# Patient Record
Sex: Female | Born: 1975 | Race: White | Hispanic: No | Marital: Married | Smoking: Former smoker
Health system: Southern US, Community
[De-identification: ages and names within clinical notes are randomized; demographics above are authoritative.]

## PROBLEM LIST (undated history)

## (undated) DIAGNOSIS — E039 Hypothyroidism, unspecified: Secondary | ICD-10-CM

## (undated) DIAGNOSIS — E785 Hyperlipidemia, unspecified: Secondary | ICD-10-CM

## (undated) DIAGNOSIS — R609 Edema, unspecified: Secondary | ICD-10-CM

## (undated) DIAGNOSIS — J40 Bronchitis, not specified as acute or chronic: Secondary | ICD-10-CM

## (undated) HISTORY — DX: Hypothyroidism, unspecified: E03.9

## (undated) HISTORY — PX: WISDOM TOOTH EXTRACTION: SHX21

## (undated) HISTORY — PX: EYE SURGERY: SHX253

## (undated) HISTORY — DX: Morbid (severe) obesity due to excess calories: E66.01

## (undated) HISTORY — DX: Hyperlipidemia, unspecified: E78.5

## (undated) HISTORY — DX: Edema, unspecified: R60.9

---

## 2006-02-19 ENCOUNTER — Emergency Department: Payer: Self-pay | Admitting: Emergency Medicine

## 2006-10-29 ENCOUNTER — Ambulatory Visit: Payer: Self-pay | Admitting: Family Medicine

## 2006-12-15 ENCOUNTER — Ambulatory Visit: Payer: Self-pay | Admitting: Internal Medicine

## 2007-01-19 IMAGING — CR DG SHOULDER 3+V*L*
1 series · 3 of 3 positions shown · non-contrast
Comparison: none

REASON FOR EXAM: Pain
COMMENTS:  LMP: Two weeks ago

PROCEDURE:     DXR - DXR SHOULDER LEFT COMPLETE  - February 19, 2006 [DATE]
RESULT:     Three views of the LEFT shoulder show no fracture, dislocation,
or other acute bony abnormality.

[Series 4185: antero_posterior · 0.11mm/px · 3 of 3 slices shown]
[im 1/3]
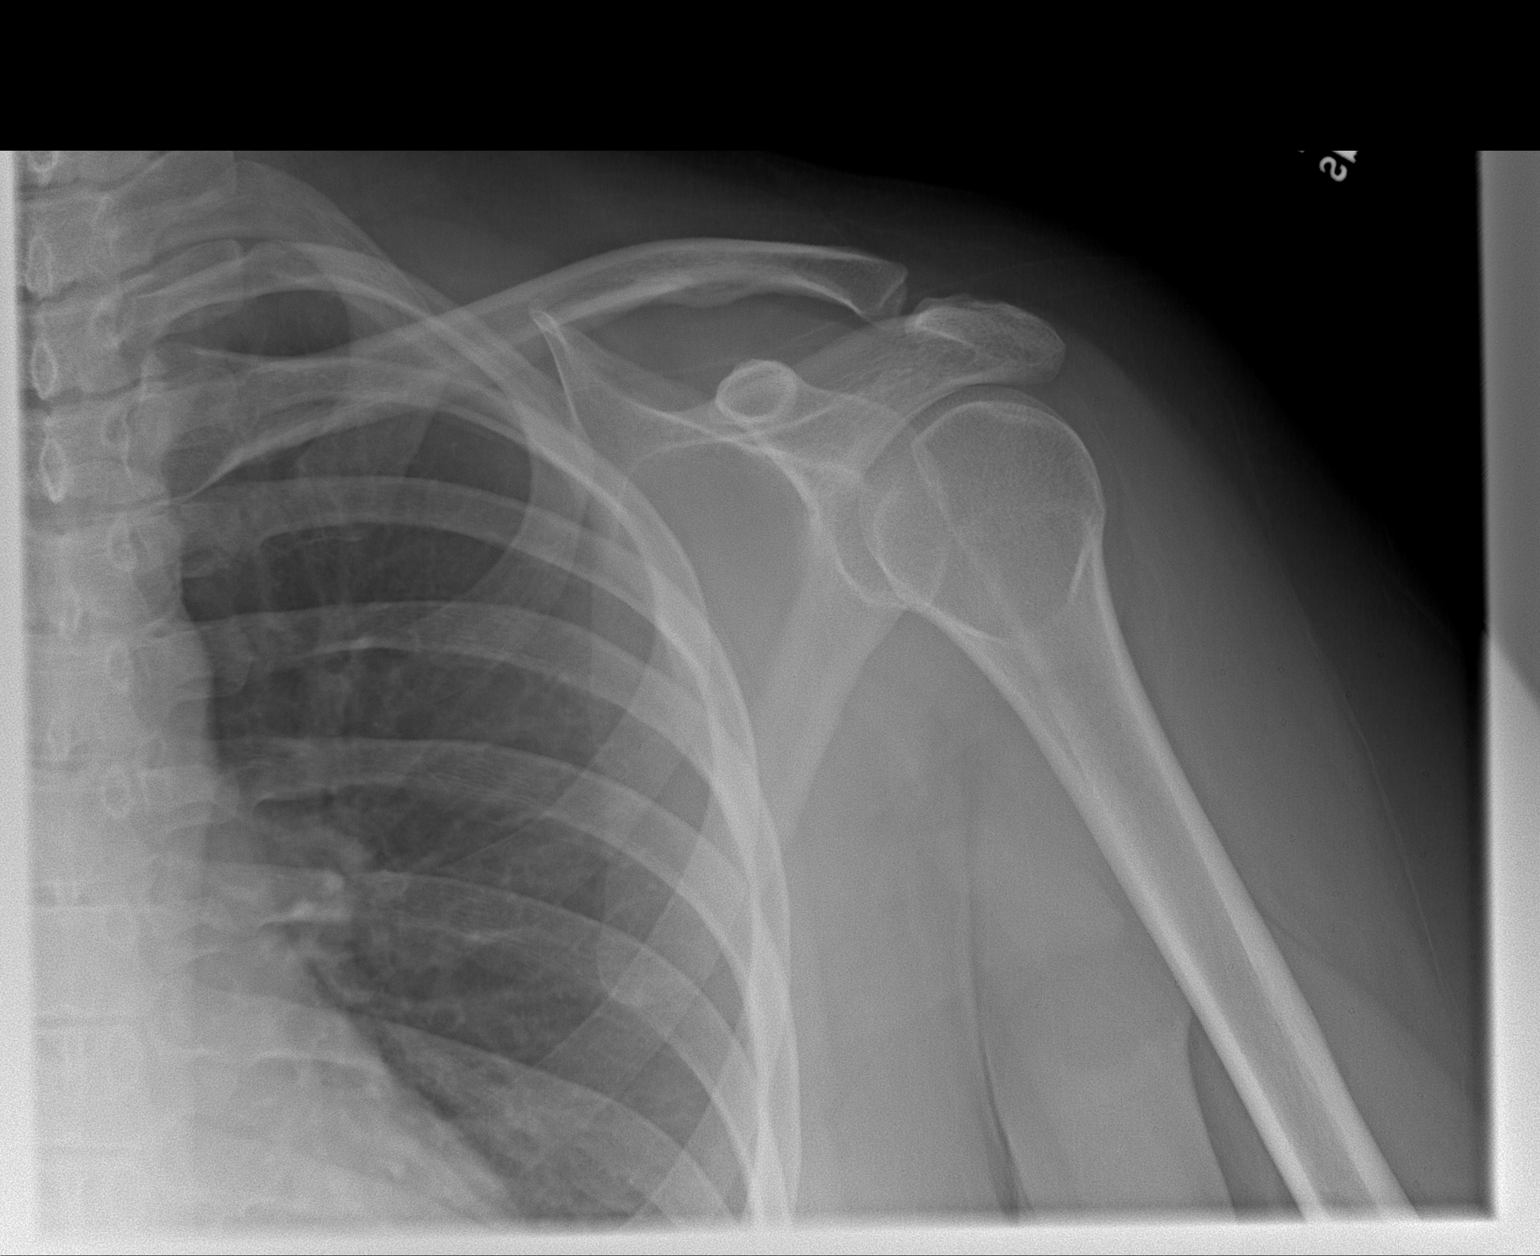
[im 2/3]
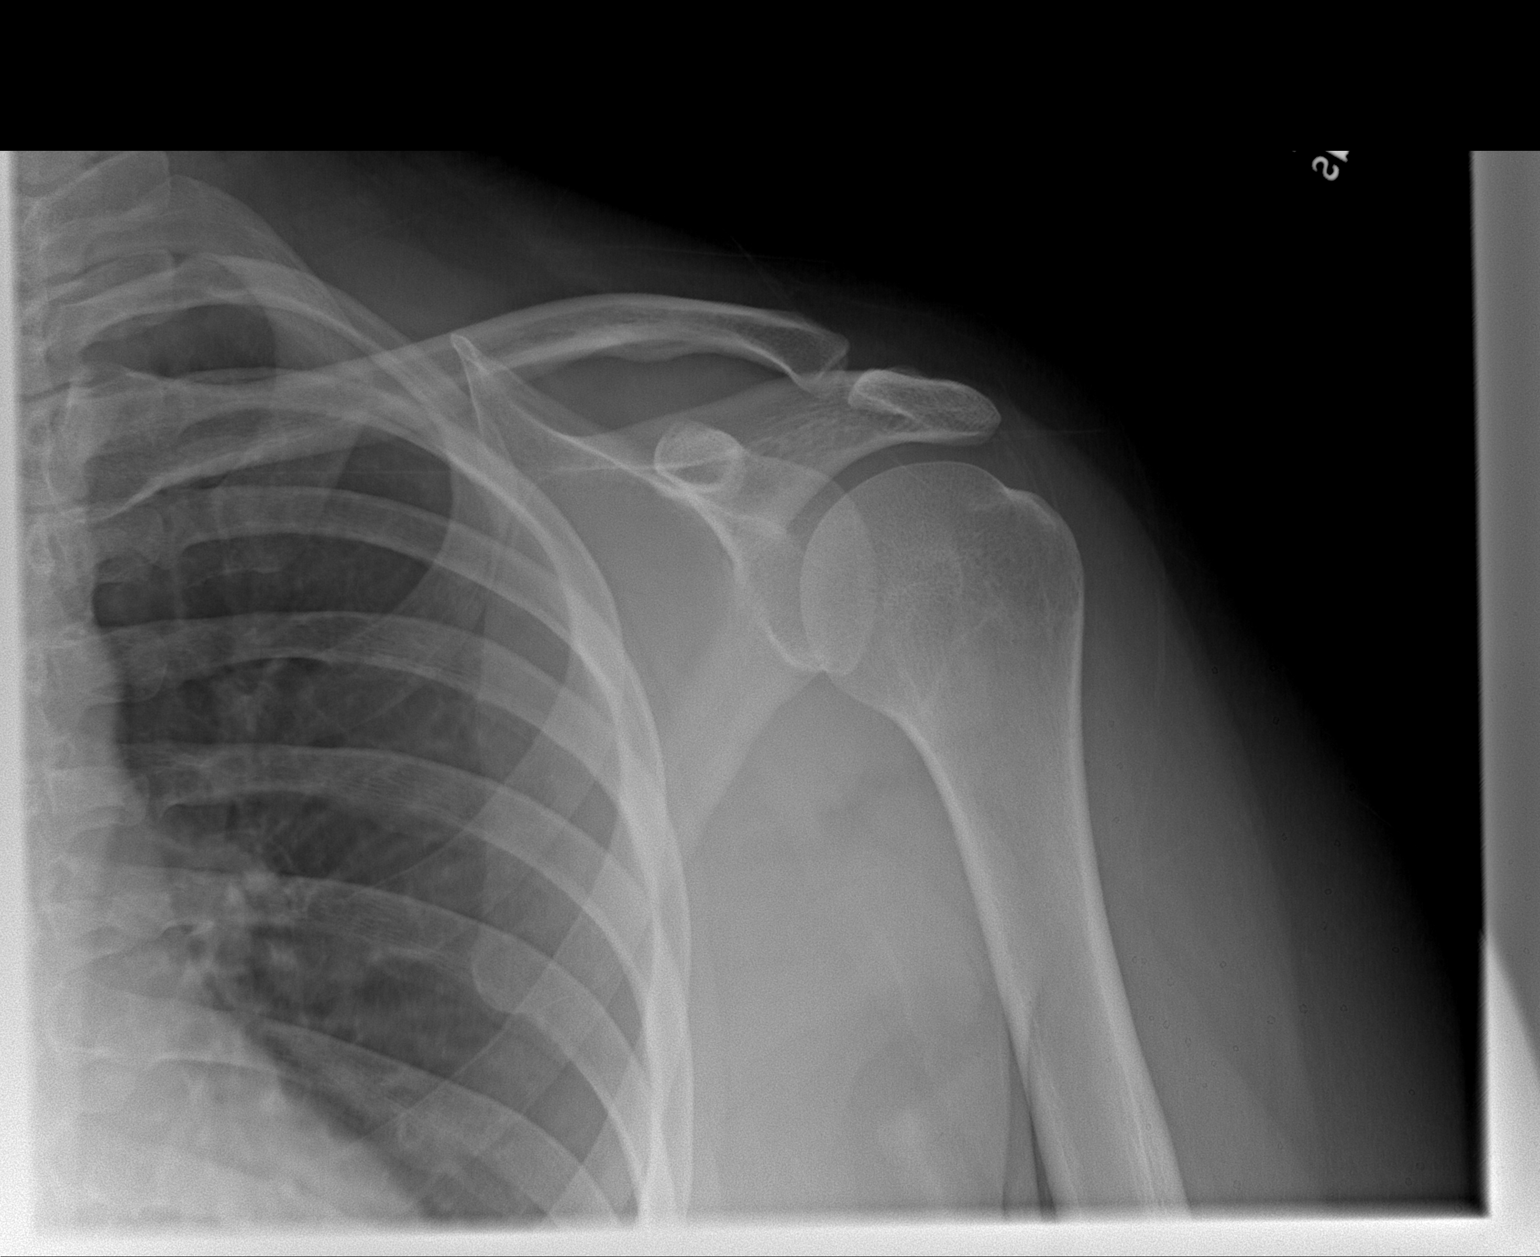
[im 3/3]
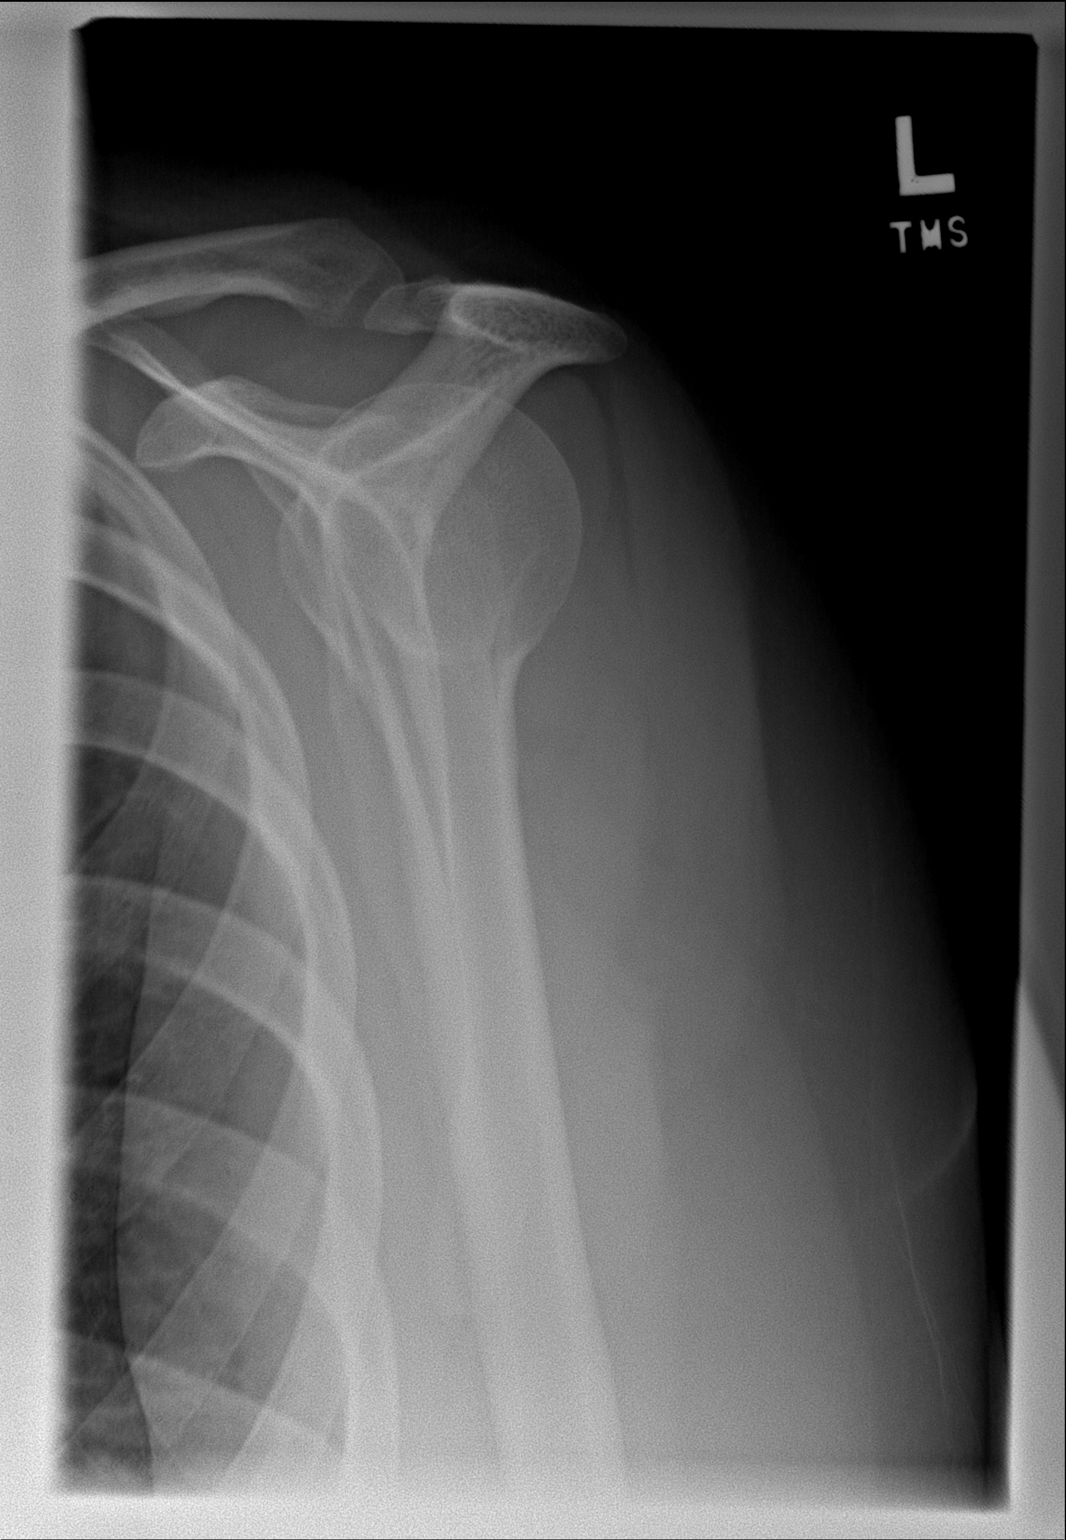

[3 of 3 positions shown; findings below may reference images not displayed]

IMPRESSION: No significant abnormalities are noted.

## 2007-09-30 ENCOUNTER — Ambulatory Visit: Payer: Self-pay | Admitting: Family Medicine

## 2007-09-30 DIAGNOSIS — R609 Edema, unspecified: Secondary | ICD-10-CM | POA: Insufficient documentation

## 2007-10-03 LAB — CONVERTED CEMR LAB
Albumin: 3.8 g/dL (ref 3.5–5.2)
Alkaline Phosphatase: 52 units/L (ref 39–117)
BUN: 15 mg/dL (ref 6–23)
Basophils Absolute: 0.1 10*3/uL (ref 0.0–0.1)
Chloride: 111 meq/L (ref 96–112)
Cholesterol: 211 mg/dL (ref 0–200)
Creatinine, Ser: 1 mg/dL (ref 0.4–1.2)
Direct LDL: 139.3 mg/dL
Eosinophils Absolute: 0.4 10*3/uL (ref 0.0–0.6)
GFR calc non Af Amer: 70 mL/min
HDL: 48.5 mg/dL (ref >39.0)
MCHC: 33.6 g/dL (ref 30.0–36.0)
MCV: 81.5 fL (ref 78.0–100.0)
Monocytes Relative: 7.9 % (ref 3.0–11.0)
Potassium: 3.9 meq/L (ref 3.5–5.1)
RBC: 4.58 M/uL (ref 3.87–5.11)
RDW: 16.2 % — ABNORMAL HIGH (ref 11.5–14.6)
Sodium: 143 meq/L (ref 135–145)
TSH: 45.42 microintl units/mL — ABNORMAL HIGH (ref 0.35–5.50)
Total Bilirubin: 0.5 mg/dL (ref 0.3–1.2)
Total CHOL/HDL Ratio: 4.4
Triglycerides: 117 mg/dL (ref 0–149)

## 2007-10-07 ENCOUNTER — Ambulatory Visit: Payer: Self-pay | Admitting: Family Medicine

## 2007-10-07 DIAGNOSIS — E039 Hypothyroidism, unspecified: Secondary | ICD-10-CM | POA: Insufficient documentation

## 2007-10-30 ENCOUNTER — Telehealth (INDEPENDENT_AMBULATORY_CARE_PROVIDER_SITE_OTHER): Payer: Self-pay | Admitting: *Deleted

## 2007-11-08 ENCOUNTER — Ambulatory Visit: Payer: Self-pay | Admitting: Internal Medicine

## 2007-11-08 LAB — CONVERTED CEMR LAB: TSH: 0.64 microintl units/mL (ref 0.35–5.50)

## 2008-11-30 ENCOUNTER — Telehealth (INDEPENDENT_AMBULATORY_CARE_PROVIDER_SITE_OTHER): Payer: Self-pay | Admitting: Internal Medicine

## 2008-12-17 ENCOUNTER — Ambulatory Visit: Payer: Self-pay | Admitting: Family Medicine

## 2008-12-22 LAB — CONVERTED CEMR LAB
ALT: 25 units/L (ref 0–35)
Cholesterol: 184 mg/dL (ref 0–200)
Free T4: 0.6 ng/dL (ref 0.6–1.6)
LDL Cholesterol: 112 mg/dL — ABNORMAL HIGH (ref 0–99)
TSH: 10.97 microintl units/mL — ABNORMAL HIGH (ref 0.35–5.50)
Triglycerides: 124 mg/dL (ref 0–149)

## 2009-05-14 ENCOUNTER — Ambulatory Visit: Payer: Self-pay | Admitting: Family Medicine

## 2009-05-14 LAB — CONVERTED CEMR LAB: Rapid Strep: NEGATIVE

## 2010-01-03 ENCOUNTER — Ambulatory Visit: Payer: Self-pay | Admitting: Family Medicine

## 2010-01-03 DIAGNOSIS — E785 Hyperlipidemia, unspecified: Secondary | ICD-10-CM | POA: Insufficient documentation

## 2010-01-05 ENCOUNTER — Encounter (INDEPENDENT_AMBULATORY_CARE_PROVIDER_SITE_OTHER): Payer: Self-pay | Admitting: *Deleted

## 2010-01-05 LAB — CONVERTED CEMR LAB
Free T4: 0.7 ng/dL (ref 0.6–1.6)
TSH: 11.57 microintl units/mL — ABNORMAL HIGH (ref 0.35–5.50)

## 2010-05-30 ENCOUNTER — Telehealth: Payer: Self-pay | Admitting: Family Medicine

## 2010-10-28 ENCOUNTER — Ambulatory Visit: Payer: Self-pay | Admitting: Family Medicine

## 2010-10-30 LAB — CONVERTED CEMR LAB
ALT: 19 units/L (ref 0–35)
AST: 18 units/L (ref 0–37)
Direct LDL: 135.3 mg/dL
TSH: 6.6 microintl units/mL — ABNORMAL HIGH (ref 0.35–5.50)

## 2010-12-13 ENCOUNTER — Ambulatory Visit: Payer: Self-pay | Admitting: Family Medicine

## 2010-12-15 LAB — CONVERTED CEMR LAB
Free T4: 0.68 ng/dL (ref 0.60–1.60)
TSH: 7.01 microintl units/mL — ABNORMAL HIGH (ref 0.35–5.50)

## 2011-01-19 NOTE — Assessment & Plan Note (Signed)
Summary: REFILL MEDICATION/CLE   Vital Signs:  Patient profile:   35 year old female Height:      66.75 inches Weight:      285.75 pounds BMI:     45.25 Temp:     98.2 degrees F oral Pulse rate:   80 / minute Pulse rhythm:   regular BP sitting:   118 / 78  (left arm) Cuff size:   large  Vitals Entered By: Lewanda Rife LPN (October 28, 2010 10:33 AM) CC: refill med   History of Present Illness: here for check of hypothyroidism  has been feeling good    had high tsh in [place] and did inc dose pt was noncompliant with f/u -- she forgot  no missed doses   wt is up 11 lb has been eating healthy and exercise -- walks her dog at least 30 minutes per day  bmi 45  also on advair-- has it on hand -- uses only in allergy season   a lot of nasal allergy symptoms - sneeze / itchy / runny nose  had used alavert -- not helpful   Allergies: 1)  ! Pcn 2)  ! Erythromycin 3)  ! * Soy  Review of Systems General:  Denies chills, fatigue, fever, loss of appetite, and malaise. Eyes:  Denies eye irritation and itching. ENT:  Complains of nasal congestion and postnasal drainage; denies sinus pressure and sore throat. CV:  Denies chest pain or discomfort and palpitations. Resp:  Denies cough and wheezing. GI:  Denies diarrhea, nausea, and vomiting. MS:  Denies joint pain, joint redness, and joint swelling. Derm:  Denies itching, lesion(s), poor wound healing, and rash. Neuro:  Denies numbness and tingling. Endo:  Denies cold intolerance and heat intolerance. Heme:  Denies abnormal bruising and bleeding.  Physical Exam  General:  overweight but generally well appearing  Head:  normocephalic, atraumatic, and no abnormalities observed.   Eyes:  vision grossly intact, pupils equal, pupils round, and pupils reactive to light.   Mouth:  pharynx pink and moist.   Neck:  supple with full rom and no masses or thyromegally, no JVD or carotid bruit  Lungs:  Normal respiratory effort, chest  expands symmetrically. Lungs are clear to auscultation, no crackles or wheezes. Heart:  Normal rate and regular rhythm. S1 and S2 normal without gallop, murmur, click, rub or other extra sounds. Abdomen:  Bowel sounds positive,abdomen soft and non-tender without masses, organomegaly or hernias noted. no renal bruits  Msk:  No deformity or scoliosis noted of thoracic or lumbar spine.   Pulses:  R and L carotid,radial,femoral,dorsalis pedis and posterior tibial pulses are full and equal bilaterally Extremities:  No clubbing, cyanosis, edema, or deformity noted with normal full range of motion of all joints.   Neurologic:  no tremor  sensation intact to light touch, gait normal, and DTRs symmetrical and normal.   Skin:  Intact without suspicious lesions or rashes Cervical Nodes:  No lymphadenopathy noted Psych:  normal affect, talkative and pleasant    Impression & Recommendations:  Problem # 1:  HYPOTHYROIDISM (ICD-244.9) Assessment Unchanged  check tsh today- overdue wt gain-- May need to inc dose (otherwise clinically stable) update when labs return  Her updated medication list for this problem includes:    Synthroid 100 Mcg Tabs (Levothyroxine sodium) .Marland Kitchen... Take one by mouth daily  Orders: Venipuncture (16109) TLB-Lipid Panel (80061-LIPID) TLB-TSH (Thyroid Stimulating Hormone) (84443-TSH) TLB-AST (SGOT) (84450-SGOT) TLB-ALT (SGPT) (84460-ALT)  Labs Reviewed: TSH: 11.57 (01/03/2010)  Chol: 184 (12/17/2008)   HDL: 47.5 (12/17/2008)   LDL: 112 (12/17/2008)   TG: 124 (12/17/2008)  Problem # 2:  HYPERLIPIDEMIA (ICD-272.4) Assessment: Unchanged has been better controlled with diet -  which pt says is good  also better thyroid control lab today and update counseled on low sat fat diet Orders: Venipuncture (09811) TLB-Lipid Panel (80061-LIPID) TLB-TSH (Thyroid Stimulating Hormone) (84443-TSH) TLB-AST (SGOT) (84450-SGOT) TLB-ALT (SGPT) (84460-ALT)  Labs Reviewed: SGOT: 22  (12/17/2008)   SGPT: 25 (12/17/2008)   HDL:47.5 (12/17/2008), 48.5 (09/30/2007)  LDL:112 (12/17/2008), DEL (09/30/2007)  Chol:184 (12/17/2008), 211 (09/30/2007)  Trig:124 (12/17/2008), 117 (09/30/2007)  Complete Medication List: 1)  Advair Diskus 100-50 Mcg/dose Misc (Fluticasone-salmeterol) .Marland Kitchen.. 1 puff two times a day as needed 2)  Synthroid 100 Mcg Tabs (Levothyroxine sodium) .... Take one by mouth daily  Other Orders: Admin 1st Vaccine (91478) Flu Vaccine 21yrs + [phone])  Patient Instructions: 1)  for allergies -- try zyrec 10 mg each am  2)  will call you monday about synthroid dose  3)  I recommend [facility] for [facility] care  4)  ask for the phone number when you check out  5)  lab today   Orders Added: 1)  Venipuncture [36415] 2)  TLB-Lipid Panel [80061-LIPID] 3)  TLB-TSH (Thyroid Stimulating Hormone) [84443-TSH] 4)  TLB-AST (SGOT) [84450-SGOT] 5)  TLB-ALT (SGPT) [84460-ALT] 6)  Admin 1st Vaccine [90471] 7)  Flu Vaccine 16yrs + [90658] 8)  Est. Patient Level III [13086]    Prior Medications: ADVAIR DISKUS 100-50 MCG/DOSE  MISC (FLUTICASONE-SALMETEROL) 1 puff two times a day as needed SYNTHROID 100 MCG TABS (LEVOTHYROXINE SODIUM) take one by mouth daily Current Allergies: ! PCN ! ERYTHROMYCIN ! * SOY Flu Vaccine Consent Questions     Do you have a history of severe allergic reactions to this vaccine? no    Any prior history of allergic reactions to egg and/or gelatin? no    Do you have a sensitivity to the preservative Thimersol? no    Do you have a past history of Guillan-Barre Syndrome? no    Do you currently have an acute febrile illness? no    Have you ever had a severe reaction to latex? no    Vaccine information given and explained to patient? yes    Are you currently pregnant? no    Lot Number:[id number]   Exp Date:06/17/2011   Site Given  Left Deltoid IM Lewanda Rife LPN  October 28, 2010 3:56 PM  .[email or web address]

## 2011-01-19 NOTE — Assessment & Plan Note (Signed)
Summary: MED REFILL/DLO   Vital Signs:  Patient profile:   35 year old female Weight:      274 pounds Temp:     98.2 degrees F oral Pulse rate:   72 / minute Pulse rhythm:   regular BP sitting:   122 / 90  (left arm) Cuff size:   large  Vitals Entered By: Lowella Petties CMA (January 03, 2010 1:57 PM) CC: Refill synthroid   History of Present Illness: here for f/u of hypothyroidism  has been feeling good overall   had [place] the [place]  shoulder L still bothers her  did have an x ray at time of fall -- no fx  has been a year -- and still hurts  has to sleep with pillow under her arm  hurts most to internallly rotate  hard to fully abduct -- over 90 degrees  overall much improved from initial injury  not sore to the touch  occ fingers go to sleep at night    pt had last labs in 09-- with high tsh and dose was adj of synthroid  she failed to f/u for re check after that  thyroid feels much better - since last dose change  hair and skin are ok  overall much better   wt is up 4 lb from last visit   cholesterol improved with last labs -- LDL down from 140 to 112 is still doing very well with low sat fat diet       Allergies: 1)  ! Pcn 2)  ! Erythromycin 3)  ! 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 Eyes:  Denies blurring and eye irritation. CV:  Denies chest pain or discomfort, palpitations, and shortness of breath with exertion. Resp:  Denies cough and wheezing. GI:  Denies abdominal pain, change in bowel habits, indigestion, and nausea. MS:  Complains of joint pain and stiffness; denies joint redness, joint swelling, muscle aches, and cramps. Derm:  Denies itching, lesion(s), poor wound healing, and rash. Neuro:   Denies numbness, tremors, and weakness. Psych:  Denies anxiety and depression. Endo:  Denies cold intolerance and heat intolerance.  Physical Exam  General:  overweight but generally well appearing  Head:  normocephalic, atraumatic, and no abnormalities observed.   Eyes:  vision grossly intact, pupils equal, pupils round, and pupils reactive to light.   Mouth:  pharynx pink and moist.   Neck:  supple with full rom and no masses or thyromegally, no JVD or carotid bruit  Lungs:  Normal respiratory effort, chest expands symmetrically. Lungs are clear to auscultation, no crackles or wheezes. Heart:  Normal rate and regular rhythm. S1 and S2 normal without gallop, murmur, click, rub or other extra sounds. Msk:  No deformity or scoliosis noted of thoracic or lumbar spine.   L shoulder - tender over acromion pos hawkings and neer test for ant shoulder pain  no swelling or deformity  pain on int rotation pain to abduct over 90 deg  Pulses:  R and L carotid,radial,femoral,dorsalis pedis and posterior tibial pulses are full and equal bilaterally Extremities:  no CCE Neurologic:  strength normal in all extremities, sensation intact to light touch, gait normal, and DTRs symmetrical and normal.   no tremor  Skin:  Intact without suspicious lesions or rashes Cervical Nodes:  No lymphadenopathy noted Psych:  normal affect, talkative and pleasant    Impression & Recommendations:  Problem # 1:  HYPOTHYROIDISM (ICD-244.9) Assessment Improved  no clinical changes on current dose - pt feels good  check tsh and free T4 today and update - then will refil med  Her updated medication list for this problem includes:    Synthroid 88 Mcg Tabs (Levothyroxine sodium) ..... One by mouth daily  Orders: Venipuncture (16109) TLB-TSH (Thyroid Stimulating Hormone) (84443-TSH) TLB-T4 (Thyrox), Free [phone])  Labs Reviewed: TSH: 10.97 (12/17/2008)    Chol: 184 (12/17/2008)   HDL: 47.5 (12/17/2008)    LDL: 112 (12/17/2008)   TG: 124 (12/17/2008)  Problem # 2:  SHOULDER PAIN, LEFT (ICD-719.41) Assessment: New new shoulder pain and stiffness/ with limited rom since injury 1 y ago  suspect frozen shoulder- will ref to ortho/may need injection or MRI  enc passive rom exercises  The following medications were removed from the medication list:    Naproxen 500 Mg Tabs (Naproxen) ..... One by mouth as needed pain  Orders: Orthopedic Referral (Ortho)  Problem # 3:  HYPERLIPIDEMIA (ICD-272.4) Assessment: Improved controlled with diet  disc labs - commended imp in LDL chol with diet  enc this and exercise / low sat fat   Complete Medication List: 1)  Advair Diskus 100-50 Mcg/dose Misc (Fluticasone-salmeterol) .Marland Kitchen.. 1 puff two times a day as needed 2)  Synthroid 88 Mcg Tabs (Levothyroxine sodium) .... One by mouth daily  Patient Instructions: 1)  labs today for thyroid  2)  will update you with result and plan  3)  keep working healthy diet and exercise  4)  will refer you to orthopedics at check out   Prior Medications (reviewed today): ADVAIR DISKUS 100-50 MCG/DOSE  MISC (FLUTICASONE-SALMETEROL) 1 puff two times a day as needed SYNTHROID 88 MCG TABS (LEVOTHYROXINE SODIUM) one by mouth daily Current Allergies: ! PCN ! ERYTHROMYCIN ! * SOY

## 2011-01-19 NOTE — Miscellaneous (Signed)
Summary: med list update  Clinical Lists Changes  Medications: Changed medication from SYNTHROID 88 MCG TABS (LEVOTHYROXINE SODIUM) one by mouth daily to SYNTHROID 100 MCG TABS (LEVOTHYROXINE SODIUM) take one by mouth daily     Prior Medications: ADVAIR DISKUS 100-50 MCG/DOSE  MISC (FLUTICASONE-SALMETEROL) 1 puff two times a day as needed Current Allergies: ! PCN ! ERYTHROMYCIN ! * SOY

## 2011-01-19 NOTE — Progress Notes (Signed)
Summary: Synthroid  Phone Note Refill Request Message from:  Fax from Pharmacy on May 30, 2010 11:06 AM  Refills Requested: Medication #1:  SYNTHROID 100 MCG TABS take one by mouth daily. Patient was supposed to scheduled lab appt and refused after last RF.  [facility] Phone:   [phone]   Method Requested: Electronic Initial call taken by: Delilah Shan CMA Duncan Dull),  May 30, 2010 11:07 AM  Follow-up for Phone Call        we changed her dose in january so I need to re check tsh/ free T4 to make sure she is on the appropriate dose- please sched px written on EMR for call in - can refil 1 mo while waiting on lab Follow-up by: Judith Part MD,  May 30, 2010 11:24 AM  Additional Follow-up for Phone Call Additional follow up Details #1::        Medication phoned to [facility]. pharmacy as instructed. Requested note put with rx for pt to call our office for an appt. Left message for patient to call back at only # available for pt [phone]. Lewanda Rife LPN  May 30, 2010 12:26 PM     New/Updated Medications: SYNTHROID 100 MCG TABS (LEVOTHYROXINE SODIUM) take one by mouth daily Prescriptions: SYNTHROID 100 MCG TABS (LEVOTHYROXINE SODIUM) take one by mouth daily  #30 x 0   Entered and Authorized by:   Judith Part MD   Signed by:   Lewanda Rife LPN on 19/14/7829   Method used:   Telephoned to ...       [facility] [address] (retail)       [address] [place], [place]  [address]       Ph: [phone]       Fax: [phone]   RxID:   [id number]

## 2011-03-15 ENCOUNTER — Encounter: Payer: Self-pay | Admitting: Family Medicine

## 2011-03-16 ENCOUNTER — Telehealth: Payer: Self-pay | Admitting: Family Medicine

## 2011-03-16 DIAGNOSIS — Z0000 Encounter for general adult medical examination without abnormal findings: Secondary | ICD-10-CM

## 2011-03-16 NOTE — Telephone Encounter (Signed)
Message copied by Roxy Manns on Thu Mar 16, 2011  8:26 AM ------      Message from: Margarite Gouge, NATASHA      Created: Thu Mar 16, 2011  8:10 AM      Regarding: Cpx labs Fri       Please order  future cpx labs for pt's upcomming lab appt.      Thanks      Rodney Booze

## 2011-03-16 NOTE — Telephone Encounter (Signed)
Wellness and lipid

## 2011-03-17 ENCOUNTER — Other Ambulatory Visit (INDEPENDENT_AMBULATORY_CARE_PROVIDER_SITE_OTHER): Payer: 59

## 2011-03-17 ENCOUNTER — Ambulatory Visit (INDEPENDENT_AMBULATORY_CARE_PROVIDER_SITE_OTHER): Payer: 59 | Admitting: Family Medicine

## 2011-03-17 ENCOUNTER — Encounter: Payer: Self-pay | Admitting: Family Medicine

## 2011-03-17 VITALS — BP 112/74 | HR 74 | Temp 98.5°F | Wt 284.0 lb

## 2011-03-17 DIAGNOSIS — H60392 Other infective otitis externa, left ear: Secondary | ICD-10-CM | POA: Insufficient documentation

## 2011-03-17 DIAGNOSIS — N912 Amenorrhea, unspecified: Secondary | ICD-10-CM | POA: Insufficient documentation

## 2011-03-17 DIAGNOSIS — H60399 Other infective otitis externa, unspecified ear: Secondary | ICD-10-CM

## 2011-03-17 DIAGNOSIS — Z0000 Encounter for general adult medical examination without abnormal findings: Secondary | ICD-10-CM

## 2011-03-17 LAB — LIPID PANEL
HDL: 50.5 mg/dL (ref >39.00)
Total CHOL/HDL Ratio: 4
Triglycerides: 130 mg/dL (ref 0.0–149.0)
VLDL: 26 mg/dL (ref 0.0–40.0)

## 2011-03-17 LAB — COMPREHENSIVE METABOLIC PANEL
ALT: 20 U/L (ref 0–35)
AST: 16 U/L (ref 0–37)
Alkaline Phosphatase: 50 U/L (ref 39–117)
Creatinine, Ser: 0.9 mg/dL (ref 0.4–1.2)
GFR: 72.71 mL/min (ref >60.00)
Sodium: 140 mEq/L (ref 135–145)
Total Bilirubin: 0.2 mg/dL — ABNORMAL LOW (ref 0.3–1.2)
Total Protein: 6.4 g/dL (ref 6.0–8.3)

## 2011-03-17 LAB — CBC WITH DIFFERENTIAL/PLATELET
Basophils Absolute: 0 10*3/uL (ref 0.0–0.1)
Eosinophils Absolute: 0.3 10*3/uL (ref 0.0–0.7)
HCT: 39 % (ref 36.0–46.0)
Hemoglobin: 13 g/dL (ref 12.0–15.0)
Lymphs Abs: 1.7 10*3/uL (ref 0.7–4.0)
MCHC: 33.3 g/dL (ref 30.0–36.0)
MCV: 81.6 fl (ref 78.0–100.0)
Monocytes Absolute: 0.4 10*3/uL (ref 0.1–1.0)
Monocytes Relative: 7.6 % (ref 3.0–12.0)
Neutro Abs: 3 10*3/uL (ref 1.4–7.7)
Platelets: 204 10*3/uL (ref 150.0–400.0)
RDW: 15.5 % — ABNORMAL HIGH (ref 11.5–14.6)

## 2011-03-17 LAB — POCT URINE PREGNANCY: Preg Test, Ur: NEGATIVE

## 2011-03-17 MED ORDER — NEOMYCIN-POLYMYXIN-HC 3.5-10000-1 OT SOLN: 4.0000 [drp] | Freq: Three times a day (TID) | OTIC | Status: AC

## 2011-03-17 NOTE — Assessment & Plan Note (Signed)
Checked Upreg = neg.

## 2011-03-17 NOTE — Patient Instructions (Signed)
U preg was negative. I think you had external ear infection, slowly resolving on own.  We will provide you with antibiotic drops in case doesn't continue to improve as expected. Update [place] if worsening or any change.

## 2011-03-17 NOTE — Assessment & Plan Note (Signed)
Suspect L otitis externa, resolving on own.  Given improved, monitor for now.  Provided with cortisporin drops in case starts deteriorating again.  TM clear without perf today.

## 2011-03-17 NOTE — Progress Notes (Signed)
  Subjective:    Patient ID: Nancy Dunlap, female    DOB: 10-05-1976, 35 y.o.   MRN: [id number]  HPI CC: L ear pain  1 1/2 wk h/o ear pai, actually better today.  Also had TMJ tenderness.  Denies injury or trauma.  Started in ear then moved to jaw.  Started around time when allergies started.  Ear felt warm   No discharge from ear, no hearing changes, no ringing in ears.  No recent swimming.  No fevers/chills, congestion, RN, sneezing, coughing.  No tooth pain.  Tried peroxide then debrox which didn't really help.  H/o allergies, takes antihistamine and advair as needed.  Feels pretty stable this season so far.  1 wk late, could be pregnant.  Engaged 1 mo ago.  Thinks may be late from stress.  Requests Upreg.  Review of Systems Per HPI    Objective:   Physical Exam  Vitals reviewed. Constitutional: She appears well-developed and well-nourished. No distress.  HENT:  Head: Normocephalic and atraumatic.  Right Ear: Tympanic membrane and external ear normal.  Left Ear: Hearing, tympanic membrane and external ear normal. No drainage. No decreased hearing is noted.  Nose: Nose normal.  Mouth/Throat: No oropharyngeal exudate.       External canal with slight maceration, slightly tender to palpation at tragus.  No TMJ pain  Eyes: Conjunctivae and EOM are normal. Pupils are equal, round, and reactive to light. No scleral icterus.  Neck: Normal range of motion. Neck supple. No thyromegaly present.  Lymphadenopathy:    She has no cervical adenopathy.          Assessment & Plan:

## 2011-03-29 MED ORDER — LEVOTHYROXINE SODIUM 125 MCG PO TABS: 125.0000 ug | ORAL_TABLET | Freq: Every day | ORAL | Status: DC

## 2011-03-29 NOTE — Patient Instructions (Signed)
Please let pt know that she needs to go back down a bit on her thyroid med from 150 to 125 mcg Px written for call in   Will disc further and re check when she f/u for PE-thanks

## 2011-03-29 NOTE — Progress Notes (Signed)
Addended by: Roxy Manns on: 03/29/2011 04:11 PM   Modules accepted: Orders

## 2011-11-13 ENCOUNTER — Other Ambulatory Visit: Payer: Self-pay | Admitting: Family Medicine

## 2011-12-08 ENCOUNTER — Other Ambulatory Visit: Payer: Self-pay | Admitting: Family Medicine

## 2012-03-01 ENCOUNTER — Other Ambulatory Visit: Payer: Self-pay | Admitting: Family Medicine

## 2012-03-01 NOTE — Telephone Encounter (Signed)
Pt last seen 03/17/11 by Dr Reece Agar. No future appt scheduled is OK to refill?

## 2012-03-03 NOTE — Telephone Encounter (Signed)
Will refill electronically  

## 2012-04-05 ENCOUNTER — Other Ambulatory Visit: Payer: Self-pay | Admitting: Family Medicine

## 2012-04-05 NOTE — Telephone Encounter (Signed)
Ok to refill?  Last OV 11/2010.

## 2012-04-05 NOTE — Telephone Encounter (Signed)
Will refill electronically for 1 mo Schedule f/u please

## 2012-04-05 NOTE — Telephone Encounter (Signed)
Left message on machine that pt needs to schedule an OV in order to continue to receive refills.

## 2012-04-10 ENCOUNTER — Encounter: Payer: Self-pay | Admitting: Family Medicine

## 2012-04-10 ENCOUNTER — Ambulatory Visit (INDEPENDENT_AMBULATORY_CARE_PROVIDER_SITE_OTHER): Payer: BC Managed Care – PPO | Admitting: Family Medicine

## 2012-04-10 VITALS — BP 110/72 | HR 80 | Temp 97.9°F | Ht 68.0 in | Wt 304.8 lb

## 2012-04-10 DIAGNOSIS — E039 Hypothyroidism, unspecified: Secondary | ICD-10-CM

## 2012-04-10 DIAGNOSIS — E785 Hyperlipidemia, unspecified: Secondary | ICD-10-CM

## 2012-04-10 LAB — LIPID PANEL
Cholesterol: 208 mg/dL — ABNORMAL HIGH (ref 0–200)
Total CHOL/HDL Ratio: 3
VLDL: 25.4 mg/dL (ref 0.0–40.0)

## 2012-04-10 LAB — LDL CHOLESTEROL, DIRECT: Direct LDL: 126.4 mg/dL

## 2012-04-10 MED ORDER — FLUTICASONE-SALMETEROL 100-50 MCG/DOSE IN AEPB: 1.0000 | INHALATION_SPRAY | Freq: Two times a day (BID) | RESPIRATORY_TRACT | Status: DC

## 2012-04-10 NOTE — Assessment & Plan Note (Signed)
Lab today Per pt diet has been low fat Did just come back from vacation Disc goals for lipids and reasons to control them Rev labs with pt- from last draw  Rev low sat fat diet in detail

## 2012-04-10 NOTE — Assessment & Plan Note (Signed)
Discussed how this problem influences overall health and the risks it imposes  Reviewed plan for weight loss with lower calorie diet (via better food choices and also portion control or program like weight watchers) and exercise building up to or more than 30 minutes 5 days per week including some aerobic activity   Checking thyroid today Pt is motivated for change

## 2012-04-10 NOTE — Patient Instructions (Signed)
Labs today for thyroid and cholesterol  Work on getting back to exercise- work up to 5 days per week 30 minutes  Also eat healthy low fat diet  Will refil med when labs return

## 2012-04-10 NOTE — Progress Notes (Signed)
Subjective:    Patient ID: Nancy Dunlap, female    DOB: [birth year], 36 y.o.   MRN: [id number]  HPI Here for f/u of hypothyroidism  Also hx of morbid obesity and lipids  Also has a nasty sunburn - was on a cruise -- burned on the very last day  A few white spots- not really blistery  No fever  Using aloe gel and taking ibuprofen   Is doing well , just got married - things are good  Has been feeling good  Stress of wedding is going down now  Now things are settling down  Inheriting a 36 and 16 year old step children- a bit of a challenge   Is feeling ok   Wt is up 20 lb from last visit  bmi is 46    bp is 110/72--good  Lab Results  Component Value Date   TSH 0.16* 03/17/2011   did change her dose  No hair loss, no change in skin  Wt is up - not having the time to take care of herself  Her new townhouse has a gym- is able to go now  Diet is pretty healthy  Lab Results  Component Value Date   CHOL 189 03/17/2011   HDL 50.50 03/17/2011   LDLCALC 113* 03/17/2011   LDLDIRECT 135.3 10/28/2010   TRIG 130.0 03/17/2011   CHOLHDL 4 03/17/2011   was pretty good last time   Patient Active Problem List  Diagnoses  . HYPOTHYROIDISM  . HYPERLIPIDEMIA  . OBESITY, MORBID  . LEG EDEMA  . Routine general medical examination at a health care facility  . Infection of ear, external, left  . Amenorrhea   Past Medical History  Diagnosis Date  . Other and unspecified hyperlipidemia   . Unspecified hypothyroidism   . Edema   . Morbid obesity    No past surgical history on file. History  Substance Use Topics  . Smoking status: Former Games developer  . Smokeless tobacco: Not on file   Comment: Quit in college and was not an everyday smoker  . Alcohol Use: Yes     Occasional   No family history on file. Allergies  Allergen Reactions  . Erythromycin     REACTION: n \\T \ v  . Penicillins     REACTION: mom has rxn   Current Outpatient Prescriptions on File Prior to Visit    Medication Sig Dispense Refill  . Fluticasone-Salmeterol (ADVAIR DISKUS) 100-50 MCG/DOSE AEPB Inhale 1 puff into the lungs 2 (two) times daily.  60 each  5  . SYNTHROID 150 MCG tablet TAKE 1 TABLET DAILY  30 tablet  0  . levothyroxine (SYNTHROID) 125 MCG tablet Take 1 tablet (125 mcg total) by mouth daily.  30 tablet  11      Review of Systems Review of Systems  Constitutional: Negative for fever, appetite change, fatigue and unexpected weight change.  Eyes: Negative for pain and visual disturbance.  Respiratory: Negative for cough and shortness of breath.   Cardiovascular: Negative for cp or palpitations    Gastrointestinal: Negative for nausea, diarrhea and constipation.  Genitourinary: Negative for urgency and frequency.  Skin: Negative for pallor or rash   Neurological: Negative for weakness, light-headedness, numbness and headaches.  Hematological: Negative for adenopathy. Does not bruise/bleed easily.  Psychiatric/Behavioral: Negative for dysphoric mood. The patient is not nervous/anxious.          Objective:   Physical Exam  Constitutional: She appears well-nourished. No distress.  Obese and well appearing   HENT:  Head: Normocephalic and atraumatic.  Mouth/Throat: Oropharynx is clear and moist.  Eyes: Conjunctivae and EOM are normal. Pupils are equal, round, and reactive to light. No scleral icterus.  Neck: Normal range of motion. Neck supple. No JVD present. Carotid bruit is not present. No thyromegaly present.  Cardiovascular: Normal rate, regular rhythm, normal heart sounds and intact distal pulses.  Exam reveals no gallop.   Pulmonary/Chest: Effort normal and breath sounds normal. No respiratory distress. She has no wheezes.  Abdominal: Soft. Bowel sounds are normal. She exhibits no distension, no abdominal bruit and no mass. There is no tenderness.  Musculoskeletal: She exhibits no edema and no tenderness.  Lymphadenopathy:    She has no cervical adenopathy.   Neurological: She is alert. She has normal reflexes. She displays no tremor. No cranial nerve deficit. She exhibits normal muscle tone. Coordination normal.  Skin: Skin is warm and dry. No rash noted. No erythema. No pallor.  Psychiatric: She has a normal mood and affect.          Assessment & Plan:

## 2012-04-10 NOTE — Assessment & Plan Note (Signed)
tsh today  Last one low  Will hold off on synth refil until labs back  No clinical changes

## 2012-04-11 ENCOUNTER — Other Ambulatory Visit: Payer: Self-pay | Admitting: *Deleted

## 2012-04-11 MED ORDER — LEVOTHYROXINE SODIUM 150 MCG PO TABS: 150.0000 ug | ORAL_TABLET | Freq: Every day | ORAL | Status: DC

## 2012-05-08 ENCOUNTER — Other Ambulatory Visit: Payer: Self-pay | Admitting: Family Medicine

## 2012-10-11 ENCOUNTER — Other Ambulatory Visit: Payer: Self-pay | Admitting: Family Medicine

## 2012-11-13 ENCOUNTER — Encounter: Payer: Self-pay | Admitting: Obstetrics and Gynecology

## 2012-11-13 ENCOUNTER — Ambulatory Visit (INDEPENDENT_AMBULATORY_CARE_PROVIDER_SITE_OTHER): Payer: BC Managed Care – PPO | Admitting: Obstetrics and Gynecology

## 2012-11-13 VITALS — BP 112/73 | HR 82 | Ht 66.0 in | Wt 308.0 lb

## 2012-11-13 DIAGNOSIS — N841 Polyp of cervix uteri: Secondary | ICD-10-CM

## 2012-11-13 DIAGNOSIS — Z01419 Encounter for gynecological examination (general) (routine) without abnormal findings: Secondary | ICD-10-CM

## 2012-11-13 DIAGNOSIS — Z124 Encounter for screening for malignant neoplasm of cervix: Secondary | ICD-10-CM

## 2012-11-13 NOTE — Patient Instructions (Signed)
Preventive Care for Adults, Female A healthy lifestyle and preventive care can promote health and wellness. Preventive health guidelines for women include the following key practices.  A routine yearly physical is a good way to check with your caregiver about your health and preventive screening. It is a chance to share any concerns and updates on your health, and to receive a thorough exam.  Visit your dentist for a routine exam and preventive care every 6 months. Brush your teeth twice a day and floss once a day. Good oral hygiene prevents tooth decay and gum disease.  The frequency of eye exams is based on your age, health, family medical history, use of contact lenses, and other factors. Follow your caregiver's recommendations for frequency of eye exams.  Eat a healthy diet. Foods like vegetables, fruits, whole grains, low-fat dairy products, and lean protein foods contain the nutrients you need without too many calories. Decrease your intake of foods high in solid fats, added sugars, and salt. Eat the right amount of calories for you.Get information about a proper diet from your caregiver, if necessary.  Regular physical exercise is one of the most important things you can do for your health. Most adults should get at least 150 minutes of moderate-intensity exercise (any activity that increases your heart rate and causes you to sweat) each week. In addition, most adults need muscle-strengthening exercises on 2 or more days a week.  Maintain a healthy weight. The body mass index (BMI) is a screening tool to identify possible weight problems. It provides an estimate of body fat based on height and weight. Your caregiver can help determine your BMI, and can help you achieve or maintain a healthy weight.For adults 20 years and older:  A BMI below 18.5 is considered underweight.  A BMI of 18.5 to 24.9 is normal.  A BMI of 25 to 29.9 is considered overweight.  A BMI of 30 and above is  considered obese.  Maintain normal blood lipids and cholesterol levels by exercising and minimizing your intake of saturated fat. Eat a balanced diet with plenty of fruit and vegetables. Blood tests for lipids and cholesterol should begin at age 20 and be repeated every 5 years. If your lipid or cholesterol levels are high, you are over 50, or you are at high risk for heart disease, you may need your cholesterol levels checked more frequently.Ongoing high lipid and cholesterol levels should be treated with medicines if diet and exercise are not effective.  If you smoke, find out from your caregiver how to quit. If you do not use tobacco, do not start.  If you are pregnant, do not drink alcohol. If you are breastfeeding, be very cautious about drinking alcohol. If you are not pregnant and choose to drink alcohol, do not exceed 1 drink per day. One drink is considered to be 12 ounces (355 mL) of beer, 5 ounces (148 mL) of wine, or 1.5 ounces (44 mL) of liquor.  Avoid use of street drugs. Do not share needles with anyone. Ask for help if you need support or instructions about stopping the use of drugs.  High blood pressure causes heart disease and increases the risk of stroke. Your blood pressure should be checked at least every 1 to 2 years. Ongoing high blood pressure should be treated with medicines if weight loss and exercise are not effective.  If you are 55 to 35 years old, ask your caregiver if you should take aspirin to prevent strokes.  Diabetes   screening involves taking a blood sample to check your fasting blood sugar level. This should be done once every 3 years, after age 45, if you are within normal weight and without risk factors for diabetes. Testing should be considered at a younger age or be carried out more frequently if you are overweight and have at least 1 risk factor for diabetes.  Breast cancer screening is essential preventive care for women. You should practice "breast  self-awareness." This means understanding the normal appearance and feel of your breasts and may include breast self-examination. Any changes detected, no matter how small, should be reported to a caregiver. Women in their 20s and 30s should have a clinical breast exam (CBE) by a caregiver as part of a regular health exam every 1 to 3 years. After age 40, women should have a CBE every year. Starting at age 40, women should consider having a mammography (breast X-ray test) every year. Women who have a family history of breast cancer should talk to their caregiver about genetic screening. Women at a high risk of breast cancer should talk to their caregivers about having magnetic resonance imaging (MRI) and a mammography every year.  The Pap test is a screening test for cervical cancer. A Pap test can show cell changes on the cervix that might become cervical cancer if left untreated. A Pap test is a procedure in which cells are obtained and examined from the lower end of the uterus (cervix).  Women should have a Pap test starting at age 21.  Between ages 21 and 29, Pap tests should be repeated every 2 years.  Beginning at age 30, you should have a Pap test every 3 years as long as the past 3 Pap tests have been normal.  Some women have medical problems that increase the chance of getting cervical cancer. Talk to your caregiver about these problems. It is especially important to talk to your caregiver if a new problem develops soon after your last Pap test. In these cases, your caregiver may recommend more frequent screening and Pap tests.  The above recommendations are the same for women who have or have not gotten the vaccine for human papillomavirus (HPV).  If you had a hysterectomy for a problem that was not cancer or a condition that could lead to cancer, then you no longer need Pap tests. Even if you no longer need a Pap test, a regular exam is a good idea to make sure no other problems are  starting.  If you are between ages 65 and 70, and you have had normal Pap tests going back 10 years, you no longer need Pap tests. Even if you no longer need a Pap test, a regular exam is a good idea to make sure no other problems are starting.  If you have had past treatment for cervical cancer or a condition that could lead to cancer, you need Pap tests and screening for cancer for at least 20 years after your treatment.  If Pap tests have been discontinued, risk factors (such as a new sexual partner) need to be reassessed to determine if screening should be resumed.  The HPV test is an additional test that may be used for cervical cancer screening. The HPV test looks for the virus that can cause the cell changes on the cervix. The cells collected during the Pap test can be tested for HPV. The HPV test could be used to screen women aged 30 years and older, and should   be used in women of any age who have unclear Pap test results. After the age of 30, women should have HPV testing at the same frequency as a Pap test.  Colorectal cancer can be detected and often prevented. Most routine colorectal cancer screening begins at the age of 50 and continues through age 75. However, your caregiver may recommend screening at an earlier age if you have risk factors for colon cancer. On a yearly basis, your caregiver may provide home test kits to check for hidden blood in the stool. Use of a small camera at the end of a tube, to directly examine the colon (sigmoidoscopy or colonoscopy), can detect the earliest forms of colorectal cancer. Talk to your caregiver about this at age 50, when routine screening begins. Direct examination of the colon should be repeated every 5 to 10 years through age 75, unless early forms of pre-cancerous polyps or small growths are found.  Hepatitis C blood testing is recommended for all people born from 1945 through 1965 and any individual with known risks for hepatitis C.  Practice  safe sex. Use condoms and avoid high-risk sexual practices to reduce the spread of sexually transmitted infections (STIs). STIs include gonorrhea, chlamydia, syphilis, trichomonas, herpes, HPV, and human immunodeficiency virus (HIV). Herpes, HIV, and HPV are viral illnesses that have no cure. They can result in disability, cancer, and death. Sexually active women aged 25 and younger should be checked for chlamydia. Older women with new or multiple partners should also be tested for chlamydia. Testing for other STIs is recommended if you are sexually active and at increased risk.  Osteoporosis is a disease in which the bones lose minerals and strength with aging. This can result in serious bone fractures. The risk of osteoporosis can be identified using a bone density scan. Women ages 65 and over and women at risk for fractures or osteoporosis should discuss screening with their caregivers. Ask your caregiver whether you should take a calcium supplement or vitamin D to reduce the rate of osteoporosis.  Menopause can be associated with physical symptoms and risks. Hormone replacement therapy is available to decrease symptoms and risks. You should talk to your caregiver about whether hormone replacement therapy is right for you.  Use sunscreen with sun protection factor (SPF) of 30 or more. Apply sunscreen liberally and repeatedly throughout the day. You should seek shade when your shadow is shorter than you. Protect yourself by wearing long sleeves, pants, a wide-brimmed hat, and sunglasses year round, whenever you are outdoors.  Once a month, do a whole body skin exam, using a mirror to look at the skin on your back. Notify your caregiver of new moles, moles that have irregular borders, moles that are larger than a pencil eraser, or moles that have changed in shape or color.  Stay current with required immunizations.  Influenza. You need a dose every fall (or winter). The composition of the flu vaccine  changes each year, so being vaccinated once is not enough.  Pneumococcal polysaccharide. You need 1 to 2 doses if you smoke cigarettes or if you have certain chronic medical conditions. You need 1 dose at age 65 (or older) if you have never been vaccinated.  Tetanus, diphtheria, pertussis (Tdap, Td). Get 1 dose of Tdap vaccine if you are younger than age 65, are over 65 and have contact with an infant, are a healthcare worker, are pregnant, or simply want to be protected from whooping cough. After that, you need a Td   booster dose every 10 years. Consult your caregiver if you have not had at least 3 tetanus and diphtheria-containing shots sometime in your life or have a deep or dirty wound.  HPV. You need this vaccine if you are a woman age 26 or younger. The vaccine is given in 3 doses over 6 months.  Measles, mumps, rubella (MMR). You need at least 1 dose of MMR if you were born in 1957 or later. You may also need a second dose.  Meningococcal. If you are age 19 to 21 and a first-year college student living in a residence hall, or have one of several medical conditions, you need to get vaccinated against meningococcal disease. You may also need additional booster doses.  Zoster (shingles). If you are age 60 or older, you should get this vaccine.  Varicella (chickenpox). If you have never had chickenpox or you were vaccinated but received only 1 dose, talk to your caregiver to find out if you need this vaccine.  Hepatitis A. You need this vaccine if you have a specific risk factor for hepatitis A virus infection or you simply wish to be protected from this disease. The vaccine is usually given as 2 doses, 6 to 18 months apart.  Hepatitis B. You need this vaccine if you have a specific risk factor for hepatitis B virus infection or you simply wish to be protected from this disease. The vaccine is given in 3 doses, usually over 6 months. Preventive Services / Frequency Ages 19 to 39  Blood  pressure check.** / Every 1 to 2 years.  Lipid and cholesterol check.** / Every 5 years beginning at age 20.  Clinical breast exam.** / Every 3 years for women in their 20s and 30s.  Pap test.** / Every 2 years from ages 21 through 29. Every 3 years starting at age 30 through age 65 or 70 with a history of 3 consecutive normal Pap tests.  HPV screening.** / Every 3 years from ages 30 through ages 65 to 70 with a history of 3 consecutive normal Pap tests.  Hepatitis C blood test.** / For any individual with known risks for hepatitis C.  Skin self-exam. / Monthly.  Influenza immunization.** / Every year.  Pneumococcal polysaccharide immunization.** / 1 to 2 doses if you smoke cigarettes or if you have certain chronic medical conditions.  Tetanus, diphtheria, pertussis (Tdap, Td) immunization. / A one-time dose of Tdap vaccine. After that, you need a Td booster dose every 10 years.  HPV immunization. / 3 doses over 6 months, if you are 26 and younger.  Measles, mumps, rubella (MMR) immunization. / You need at least 1 dose of MMR if you were born in 1957 or later. You may also need a second dose.  Meningococcal immunization. / 1 dose if you are age 19 to 21 and a first-year college student living in a residence hall, or have one of several medical conditions, you need to get vaccinated against meningococcal disease. You may also need additional booster doses.  Varicella immunization.** / Consult your caregiver.  Hepatitis A immunization.** / Consult your caregiver. 2 doses, 6 to 18 months apart.  Hepatitis B immunization.** / Consult your caregiver. 3 doses usually over 6 months. ** Family history and personal history of risk and conditions may change your caregiver's recommendations. Document Released: 01/30/2002 Document Revised: 02/26/2012 Document Reviewed: 05/01/2011 [employer] Patient Information 2013 [employer].  

## 2012-11-13 NOTE — Progress Notes (Signed)
Here today for new Gyn physical.  Never had a pap smear.  She has been trying to get pregnant since April and is doing temperature charting and ovulation predictor kits.  The kits do state she is ovulating.  Husband is currently tapering off of Lexapro to help with infertility issue.

## 2012-11-13 NOTE — Progress Notes (Signed)
  Subjective:     Nancy Dunlap is a 36 y.o. female G0 with LMP 10/21/2012 and BMI 49 who is here for a comprehensive physical exam. The patient reports no problems. She has been trying to conceive since April without any success. She was just informed that her husband's sperm quality is impaired secondary to a medication that he is taking. He is being tapered off Lexapro and will be tested again in a few months. Timing of sexual intercourse reviewed with patient. Patient reports monthly menses lasting 4-5 days and occuring every 26- 32 days. Patient has been using body temperature charts and ovulation kits which have demonstrated that she is ovulation.  History   Social History  . Marital Status: Single    Spouse Name: N/A    Number of Children: N/A  . Years of Education: N/A   Occupational History  . Attorney-Travels for work    Social History Main Topics  . Smoking status: Former Games developer  . Smokeless tobacco: Not on file     Comment: Quit in college and was not an everyday smoker  . Alcohol Use: Yes     Comment: Occasional  . Drug Use: No  . Sexually Active: Not on file   Other Topics Concern  . Not on file   Social History Narrative  . No narrative on file   Health Maintenance  Topic Date Due  . Pap Smear  10/25/1995  . Tetanus/tdap  10/24/1996  . Influenza Vaccine  08/18/2012       Review of Systems A comprehensive review of systems was negative.   Objective:     GENERAL: Well-developed, well-nourished female in no acute distress.  HEENT: Normocephalic, atraumatic. Sclerae anicteric.  NECK: Supple. Normal thyroid.  LUNGS: Clear to auscultation bilaterally.  HEART: Regular rate and rhythm. BREASTS: Symmetric in size. No palpable masses or lymphadenopathy, skin changes, or nipple drainage. ABDOMEN: Soft, nontender, nondistended. No organomegaly. PELVIC: Normal external female genitalia. Vagina is pink and rugated.  Normal discharge. Cervix appears bulky with a  small polyp visualized at the os. Bimanual exam limited secondary to body habitus. No pain EXTREMITIES: No cyanosis, clubbing, or edema, 2+ distal pulses.    Assessment:    Healthy female exam.      Plan:    pap smear collected Cervical polyp removed using a ring forcep without difficulty after informed consent was obtained. Patient advised to continue monthly breast and vulva exam Advised weight loss regimen through diet and exercise Advised to take prenatal vitamins See After Visit Summary for Counseling Recommendations

## 2012-12-10 ENCOUNTER — Other Ambulatory Visit: Payer: Self-pay | Admitting: Family Medicine

## 2013-01-15 ENCOUNTER — Other Ambulatory Visit: Payer: Self-pay | Admitting: Family Medicine

## 2013-01-15 NOTE — Telephone Encounter (Signed)
No recent appt and no future appts, ok to refill?

## 2013-01-15 NOTE — Telephone Encounter (Signed)
Please schedule f/u in the spring and refil until then, thanks  

## 2013-01-16 NOTE — Telephone Encounter (Signed)
Left voicemail requesting pt to call our office 

## 2013-01-22 NOTE — Telephone Encounter (Signed)
Left voicemail requesting pt to call office 

## 2013-01-22 NOTE — Telephone Encounter (Signed)
appt scheduled for 02/03/13 per pt request because she is off on that day, and med refill

## 2013-02-03 ENCOUNTER — Ambulatory Visit: Payer: BC Managed Care – PPO | Admitting: Family Medicine

## 2013-02-03 DIAGNOSIS — Z0289 Encounter for other administrative examinations: Secondary | ICD-10-CM

## 2013-03-07 ENCOUNTER — Other Ambulatory Visit: Payer: Self-pay | Admitting: Family Medicine

## 2013-03-08 NOTE — Telephone Encounter (Signed)
Pt no showed last med refill appt an no future appt, ok to refill?

## 2013-03-09 NOTE — Telephone Encounter (Signed)
Please try again to schedule f/u and refil until then Update me if she no shows

## 2013-03-10 NOTE — Telephone Encounter (Signed)
appt scheduled an med refilled until then

## 2013-04-04 ENCOUNTER — Other Ambulatory Visit: Payer: Self-pay | Admitting: Family Medicine

## 2013-05-02 ENCOUNTER — Ambulatory Visit: Payer: BC Managed Care – PPO | Admitting: Family Medicine

## 2013-05-05 ENCOUNTER — Ambulatory Visit (INDEPENDENT_AMBULATORY_CARE_PROVIDER_SITE_OTHER): Payer: BC Managed Care – PPO | Admitting: Family Medicine

## 2013-05-05 ENCOUNTER — Encounter: Payer: Self-pay | Admitting: Family Medicine

## 2013-05-05 VITALS — BP 112/82 | HR 89 | Temp 98.8°F | Ht 66.0 in | Wt 312.8 lb

## 2013-05-05 DIAGNOSIS — E039 Hypothyroidism, unspecified: Secondary | ICD-10-CM

## 2013-05-05 DIAGNOSIS — E785 Hyperlipidemia, unspecified: Secondary | ICD-10-CM

## 2013-05-05 LAB — TSH: TSH: 1.36 u[IU]/mL (ref 0.35–5.50)

## 2013-05-05 NOTE — Progress Notes (Signed)
Subjective:    Patient ID: Nancy Dunlap, female    DOB: 01/30/1976, 37 y.o.   MRN: [id number]  HPI Here for f/u of chronic medical problems   Overall feels good   Hypothyroid She worried about her PNV affecting her dosing - so she changed it to the evening  Hypothyroidism  Pt has no clinical changes No change in energy level/ hair or skin/ edema and no tremor Lab Results  Component Value Date   TSH 0.89 04/10/2012     Wt is up 4 lb with bmi of 50  Hyperlipidemia Lab Results  Component Value Date   CHOL 208* 04/10/2012   HDL 63.40 04/10/2012   LDLCALC 113* 03/17/2011   LDLDIRECT 126.4 04/10/2012   TRIG 127.0 04/10/2012   CHOLHDL 3 04/10/2012   good HDL  Patient Active Problem List   Diagnosis Date Noted  . Infection of ear, external, left 03/17/2011  . Amenorrhea 03/17/2011  . Routine general medical examination at a health care facility 03/16/2011  . HYPERLIPIDEMIA 01/03/2010  . HYPOTHYROIDISM 10/07/2007  . OBESITY, MORBID 09/30/2007  . LEG EDEMA 09/30/2007   Past Medical History  Diagnosis Date  . Other and unspecified hyperlipidemia   . Unspecified hypothyroidism   . Edema   . Morbid obesity    No past surgical history on file. History  Substance Use Topics  . Smoking status: Former Games developer  . Smokeless tobacco: Not on file     Comment: Quit in college and was not an everyday smoker  . Alcohol Use: No   Family History  Problem Relation Age of Onset  . Hypertension Maternal Grandmother   . Cancer Maternal Grandfather   . COPD Maternal Grandfather   . Stroke Paternal Grandfather   . COPD Paternal Grandfather    Allergies  Allergen Reactions  . Erythromycin     REACTION: n \\T \ v  . Penicillins     REACTION: mom has rxn  . Soy Allergy Hives   Current Outpatient Prescriptions on File Prior to Visit  Medication Sig Dispense Refill  . ADVAIR DISKUS 100-50 MCG/DOSE AEPB INHALE 1 PUFF TWO TIMES A DAY AS NEEDED  60 each  2  . SYNTHROID 150 MCG tablet  TAKE 1 TABLET DAILY  30 tablet  0   No current facility-administered medications on file prior to visit.    Review of Systems Review of Systems  Constitutional: Negative for fever, appetite change, fatigue and unexpected weight change.  Eyes: Negative for pain and visual disturbance.  Respiratory: Negative for cough and shortness of breath.   Cardiovascular: Negative for cp or palpitations    Gastrointestinal: Negative for nausea, diarrhea and constipation.  Genitourinary: Negative for urgency and frequency.  Skin: Negative for pallor or rash   Neurological: Negative for weakness, light-headedness, numbness and headaches.  Hematological: Negative for adenopathy. Does not bruise/bleed easily.  Psychiatric/Behavioral: Negative for dysphoric mood. The patient is not nervous/anxious.         Objective:   Physical Exam  Constitutional: She appears well-developed and well-nourished. No distress.  obese and well appearing   HENT:  Head: Normocephalic and atraumatic.  Mouth/Throat: Oropharynx is clear and moist.  Eyes: Conjunctivae and EOM are normal. Pupils are equal, round, and reactive to light. No scleral icterus.  Neck: Normal range of motion. Neck supple. No thyromegaly present.  Cardiovascular: Normal rate and regular rhythm.   Pulmonary/Chest: Effort normal and breath sounds normal.  Abdominal: Soft. Bowel sounds are normal. She exhibits no distension.  There is no tenderness.  Lymphadenopathy:    She has no cervical adenopathy.  Neurological: She is alert. She displays no tremor.  Skin: Skin is warm and dry. No rash noted. No erythema. No pallor.  Psychiatric: She has a normal mood and affect.          Assessment & Plan:

## 2013-05-05 NOTE — Assessment & Plan Note (Signed)
Rev last lipids and low sat fat diet recommended  Disc goals for chol

## 2013-05-05 NOTE — Patient Instructions (Addendum)
Lab today Will adjust thyroid medicine if needed  Take care of yourself

## 2013-05-05 NOTE — Assessment & Plan Note (Signed)
tsh today Pt is trying to conceive-so would like tsh to be between 1 and 3- will see how labs look and adj if needed Recommended wt loss as well

## 2013-05-06 ENCOUNTER — Other Ambulatory Visit: Payer: Self-pay | Admitting: *Deleted

## 2013-05-06 ENCOUNTER — Encounter: Payer: Self-pay | Admitting: *Deleted

## 2013-05-06 MED ORDER — SYNTHROID 150 MCG PO TABS: ORAL_TABLET | ORAL | Status: DC

## 2013-05-09 ENCOUNTER — Encounter: Payer: Self-pay | Admitting: Family Medicine

## 2013-08-15 ENCOUNTER — Ambulatory Visit (INDEPENDENT_AMBULATORY_CARE_PROVIDER_SITE_OTHER): Payer: BC Managed Care – PPO | Admitting: Family Medicine

## 2013-08-15 ENCOUNTER — Encounter: Payer: Self-pay | Admitting: Family Medicine

## 2013-08-15 VITALS — BP 126/90 | HR 68 | Temp 98.4°F | Wt 310.5 lb

## 2013-08-15 DIAGNOSIS — H6090 Unspecified otitis externa, unspecified ear: Secondary | ICD-10-CM | POA: Insufficient documentation

## 2013-08-15 DIAGNOSIS — H60399 Other infective otitis externa, unspecified ear: Secondary | ICD-10-CM

## 2013-08-15 MED ORDER — CIPROFLOXACIN-HYDROCORTISONE 0.2-1 % OT SUSP: 3.0000 [drp] | Freq: Two times a day (BID) | OTIC | Status: DC

## 2013-08-15 NOTE — Progress Notes (Signed)
L ear pain intermittently, worse this AM.  Pain behind the ear and inferiorly.  No R sided sx.  No FCNAVD.  Mild ST recently.  Mild cough, attributed to allergies per patient.    Meds, vitals, and allergies reviewed.   ROS: See HPI.  Otherwise, noncontributory.  nad ncat Tm wnl R canal wnl, L canal with irritation noted Pinna wnl x2 Mastoids not ttp Nasal and OP exam unremarkable Neck supple no LA

## 2013-08-15 NOTE — Assessment & Plan Note (Signed)
Start cipro drops and f/u prn.  She agrees.

## 2013-08-15 NOTE — Patient Instructions (Addendum)
Use the drop twice a day and this should improve.  Take care.

## 2013-10-23 ENCOUNTER — Other Ambulatory Visit: Payer: Self-pay

## 2014-02-09 ENCOUNTER — Other Ambulatory Visit: Payer: Self-pay | Admitting: Family Medicine

## 2014-04-17 ENCOUNTER — Other Ambulatory Visit: Payer: Self-pay | Admitting: Family Medicine

## 2014-04-20 NOTE — Telephone Encounter (Signed)
Electronic refill request, no recent/future appt., please advise  

## 2014-04-20 NOTE — Telephone Encounter (Signed)
Please schedule a f/u when able and refill until then  

## 2014-04-22 NOTE — Telephone Encounter (Signed)
appt scheduled and med refill 

## 2014-05-08 ENCOUNTER — Encounter: Payer: Self-pay | Admitting: Family Medicine

## 2014-05-08 ENCOUNTER — Ambulatory Visit (INDEPENDENT_AMBULATORY_CARE_PROVIDER_SITE_OTHER): Payer: BC Managed Care – PPO | Admitting: Family Medicine

## 2014-05-08 VITALS — BP 108/78 | HR 81 | Temp 98.5°F | Ht 66.0 in | Wt 322.2 lb

## 2014-05-08 DIAGNOSIS — E039 Hypothyroidism, unspecified: Secondary | ICD-10-CM

## 2014-05-08 MED ORDER — SYNTHROID 150 MCG PO TABS: 150.0000 ug | ORAL_TABLET | Freq: Every day | ORAL | Status: DC

## 2014-05-08 NOTE — Progress Notes (Signed)
Pre visit review using our clinic review tool, if applicable. No additional management support is needed unless otherwise documented below in the visit note. 

## 2014-05-08 NOTE — Patient Instructions (Signed)
Please send for labs from [facility] (winter)  Continue current thyroid dose Eat a healthy diet and exercise when you can   Fat and Cholesterol Control Diet Fat and cholesterol levels in your blood and organs are influenced by your diet. High levels of fat and cholesterol may lead to diseases of the heart, small and large blood vessels, gallbladder, liver, and pancreas. CONTROLLING FAT AND CHOLESTEROL WITH DIET Although exercise and lifestyle factors are important, your diet is key. That is because certain foods are known to raise cholesterol and others to lower it. The goal is to balance foods for their effect on cholesterol and more importantly, to replace saturated and trans fat with other types of fat, such as monounsaturated fat, polyunsaturated fat, and omega-3 fatty acids. On average, a person should consume no more than 15 to 17 g of saturated fat daily. Saturated and trans fats are considered "bad" fats, and they will raise LDL cholesterol. Saturated fats are primarily found in animal products such as meats, butter, and cream. However, that does not mean you need to give up all your favorite foods. Today, there are good tasting, low-fat, low-cholesterol substitutes for most of the things you like to eat. Choose low-fat or nonfat alternatives. Choose round or loin cuts of red meat. These types of cuts are lowest in fat and cholesterol. Chicken (without the skin), fish, veal, and ground [place] breast are great choices. Eliminate fatty meats, such as hot dogs and salami. Even shellfish have little or no saturated fat. Have a 3 oz (85 g) portion when you eat lean meat, poultry, or fish. Trans fats are also called "partially hydrogenated oils." They are oils that have been scientifically manipulated so that they are solid at room temperature resulting in a longer shelf life and improved taste and texture of foods in which they are added. Trans fats are found in stick margarine, some tub  margarines, cookies, crackers, and baked goods.  When baking and cooking, oils are a great substitute for butter. The monounsaturated oils are especially beneficial since it is believed they lower LDL and raise HDL. The oils you should avoid entirely are saturated tropical oils, such as coconut and palm.  Remember to eat a lot from food groups that are naturally free of saturated and trans fat, including fish, fruit, vegetables, beans, grains (barley, rice, couscous, bulgur wheat), and pasta (without cream sauces).  IDENTIFYING FOODS THAT LOWER FAT AND CHOLESTEROL  Soluble fiber may lower your cholesterol. This type of fiber is found in fruits such as apples, vegetables such as broccoli, potatoes, and carrots, legumes such as beans, peas, and lentils, and grains such as barley. Foods fortified with plant sterols (phytosterol) may also lower cholesterol. You should eat at least 2 g per day of these foods for a cholesterol lowering effect.  Read package labels to identify low-saturated fats, trans fat free, and low-fat foods at the supermarket. Select cheeses that have only 2 to 3 g saturated fat per ounce. Use a heart-healthy tub margarine that is free of trans fats or partially hydrogenated oil. When buying baked goods (cookies, crackers), avoid partially hydrogenated oils. Breads and muffins should be made from whole grains (whole-wheat or whole oat flour, instead of "flour" or "enriched flour"). Buy non-creamy canned soups with reduced salt and no added fats.  FOOD PREPARATION TECHNIQUES  Never deep-fry. If you must fry, either stir-fry, which uses very little fat, or use non-stick cooking sprays. When possible, broil, bake, or roast meats,  and steam vegetables. Instead of putting butter or margarine on vegetables, use lemon and herbs, applesauce, and cinnamon (for squash and sweet potatoes). Use nonfat yogurt, salsa, and low-fat dressings for salads.  LOW-SATURATED FAT / LOW-FAT FOOD SUBSTITUTES Meats /  Saturated Fat (g)  Avoid: Steak, marbled (3 oz/85 g) / 11 g  Choose: Steak, lean (3 oz/85 g) / 4 g  Avoid: Hamburger (3 oz/85 g) / 7 g  Choose: Hamburger, lean (3 oz/85 g) / 5 g  Avoid: Ham (3 oz/85 g) / 6 g  Choose: Ham, lean cut (3 oz/85 g) / 2.4 g  Avoid: Chicken, with skin, dark meat (3 oz/85 g) / 4 g  Choose: Chicken, skin removed, dark meat (3 oz/85 g) / 2 g  Avoid: Chicken, with skin, light meat (3 oz/85 g) / 2.5 g  Choose: Chicken, skin removed, light meat (3 oz/85 g) / 1 g Dairy / Saturated Fat (g)  Avoid: Whole milk (1 cup) / 5 g  Choose: Low-fat milk, 2% (1 cup) / 3 g  Choose: Low-fat milk, 1% (1 cup) / 1.5 g  Choose: Skim milk (1 cup) / 0.3 g  Avoid: Hard cheese (1 oz/28 g) / 6 g  Choose: Skim milk cheese (1 oz/28 g) / 2 to 3 g  Avoid: Cottage cheese, 4% fat (1 cup) / 6.5 g  Choose: Low-fat cottage cheese, 1% fat (1 cup) / 1.5 g  Avoid: Ice cream (1 cup) / 9 g  Choose: Sherbet (1 cup) / 2.5 g  Choose: Nonfat frozen yogurt (1 cup) / 0.3 g  Choose: Frozen fruit bar / trace  Avoid: Whipped cream (1 tbs) / 3.5 g  Choose: Nondairy whipped topping (1 tbs) / 1 g Condiments / Saturated Fat (g)  Avoid: Mayonnaise (1 tbs) / 2 g  Choose: Low-fat mayonnaise (1 tbs) / 1 g  Avoid: Butter (1 tbs) / 7 g  Choose: Extra light margarine (1 tbs) / 1 g  Avoid: Coconut oil (1 tbs) / 11.8 g  Choose: Olive oil (1 tbs) / 1.8 g  Choose: Corn oil (1 tbs) / 1.7 g  Choose: Safflower oil (1 tbs) / 1.2 g  Choose: Sunflower oil (1 tbs) / 1.4 g  Choose: Soybean oil (1 tbs) / 2.4 g  Choose: Canola oil (1 tbs) / 1 g Document Released: 12/04/2005 Document Revised: 03/31/2013 Document Reviewed: 05/25/2011 [employer] Patient Information 2014 [place] [place].

## 2014-05-08 NOTE — Progress Notes (Signed)
Subjective:    Patient ID: Nancy Dunlap, female    DOB: 04/02/1976, 38 y.o.   MRN: [id number]  HPI Here for f/u of chronic medical conditions   Has been feeling good  Continuing to try to get pregnant  She has been to gyn and husb has been to urol (low sperm count)- he is on clomid   She continues to have regular periods  She gets more swelling in her feet right before pregnancy  She is working long long hours  Not a lot of time for self care - and just put their house on the market - and will be moving  Diet is fair/not exercising     Wt is up 12 lb with bmi of 52 Morbidly obese   Hypothyroid Feels ok  Her gyn checked it in December- and levels were fine --- Dr Rana SnareLowe  No fatigue/hair or skin changes  Lab Results  Component Value Date   TSH 1.36 05/05/2013     Hx of hyperlipidemia Lab Results  Component Value Date   CHOL 208* 04/10/2012   HDL 63.40 04/10/2012   LDLCALC 113* 03/17/2011   LDLDIRECT 126.4 04/10/2012   TRIG 127.0 04/10/2012   CHOLHDL 3 04/10/2012    Diet - is not optimal at times due to work schedule / she eats fast food a little bit   Patient Active Problem List   Diagnosis Date Noted  . Amenorrhea 03/17/2011  . Routine general medical examination at a health care facility 03/16/2011  . HYPERLIPIDEMIA 01/03/2010  . HYPOTHYROIDISM 10/07/2007  . OBESITY, MORBID 09/30/2007  . LEG EDEMA 09/30/2007   Past Medical History  Diagnosis Date  . Other and unspecified hyperlipidemia   . Unspecified hypothyroidism   . Edema   . Morbid obesity    No past surgical history on file. History  Substance Use Topics  . Smoking status: Former Games developermoker  . Smokeless tobacco: Not on file     Comment: Quit in college and was not an everyday smoker  . Alcohol Use: No   Family History  Problem Relation Age of Onset  . Hypertension Maternal Grandmother   . Cancer Maternal Grandfather   . COPD Maternal Grandfather   . Stroke Paternal Grandfather   . COPD Paternal  Grandfather    Allergies  Allergen Reactions  . Erythromycin     REACTION: n \\T \ v  . Penicillins     REACTION: mom has rxn  . Soy Allergy Hives   Current Outpatient Prescriptions on File Prior to Visit  Medication Sig Dispense Refill  . ADVAIR DISKUS 100-50 MCG/DOSE AEPB INHALE 1 PUFF TWO TIMES A DAY AS NEEDED  60 each  5  . SYNTHROID 150 MCG tablet TAKE 1 TABLET DAILY  30 tablet  0   No current facility-administered medications on file prior to visit.    Review of Systems Review of Systems  Constitutional: Negative for fever, appetite change, fatigue and unexpected weight change.  Eyes: Negative for pain and visual disturbance.  Respiratory: Negative for cough and shortness of breath.   Cardiovascular: Negative for cp or palpitations    Gastrointestinal: Negative for nausea, diarrhea and constipation.  Genitourinary: Negative for urgency and frequency.  Skin: Negative for pallor or rash   Neurological: Negative for weakness, light-headedness, numbness and headaches.  Hematological: Negative for adenopathy. Does not bruise/bleed easily.  Psychiatric/Behavioral: Negative for dysphoric mood. The patient is not nervous/anxious.         Objective:  Physical Exam  Constitutional: She appears well-developed and well-nourished. No distress.  Morbidly obese and well appearing   HENT:  Head: Normocephalic and atraumatic.  Mouth/Throat: Oropharynx is clear and moist.  Eyes: Conjunctivae and EOM are normal. Pupils are equal, round, and reactive to light. Right eye exhibits no discharge. Left eye exhibits no discharge. No scleral icterus.  Neck: Normal range of motion. Neck supple. No thyromegaly present.  Cardiovascular: Normal rate and regular rhythm.   Pulmonary/Chest: Effort normal and breath sounds normal. No respiratory distress. She has no wheezes.  Abdominal: Soft. Bowel sounds are normal.  Musculoskeletal: She exhibits no edema.  Lymphadenopathy:    She has no cervical  adenopathy.  Neurological: She is alert. She has normal reflexes. She displays no tremor. No cranial nerve deficit. She exhibits normal muscle tone. Coordination normal.  Skin: Skin is warm and dry. No rash noted. No pallor.  Psychiatric: She has a normal mood and affect.          Assessment & Plan:

## 2014-05-10 NOTE — Assessment & Plan Note (Signed)
Discussed how this problem influences overall health and the risks it imposes  Reviewed plan for weight loss with lower calorie diet (via better food choices and also portion control or program like weight watchers) and exercise building up to or more than 30 minutes 5 days per week including some aerobic activity   Esp in light of attempt to get pregnant

## 2014-05-10 NOTE — Assessment & Plan Note (Signed)
Per pt -nl tsh in Dec at Dr Vance Gather office  Sent for that  Refill med No clinical changes

## 2014-10-28 ENCOUNTER — Encounter: Payer: Self-pay | Admitting: Family Medicine

## 2014-10-28 ENCOUNTER — Ambulatory Visit (INDEPENDENT_AMBULATORY_CARE_PROVIDER_SITE_OTHER): Payer: BC Managed Care – PPO | Admitting: Family Medicine

## 2014-10-28 VITALS — BP 102/64 | HR 88 | Temp 98.6°F | Ht 66.0 in | Wt 317.2 lb

## 2014-10-28 DIAGNOSIS — J209 Acute bronchitis, unspecified: Secondary | ICD-10-CM | POA: Insufficient documentation

## 2014-10-28 MED ORDER — GUAIFENESIN-CODEINE 100-10 MG/5ML PO SYRP: 5.0000 mL | ORAL_SOLUTION | Freq: Three times a day (TID) | ORAL | Status: DC | PRN

## 2014-10-28 MED ORDER — AZITHROMYCIN 250 MG PO TABS: ORAL_TABLET | ORAL | Status: DC

## 2014-10-28 NOTE — Progress Notes (Signed)
Subjective:    Patient ID: Nancy Dunlap, female    DOB: 02/05/1976, 38 y.o.   MRN: [id number]  HPI Here with a cough - close to 1 mo  Was moving-thought it was drip and dust exposure  Worse in the past week   Phlegm- yellow/green  Last night a little pain over ears / face  Now nasal d/c that is yellow also   No fever or wheeze  Has been using advair -no help/ just makes her cough more   Has taken robitussin DM 1 week -no improvement  Mucinex DM 12 hour -no help either  Patient Active Problem List   Diagnosis Date Noted  . Amenorrhea 03/17/2011  . Routine general medical examination at a health care facility 03/16/2011  . HYPERLIPIDEMIA 01/03/2010  . HYPOTHYROIDISM 10/07/2007  . OBESITY, MORBID 09/30/2007  . LEG EDEMA 09/30/2007   Past Medical History  Diagnosis Date  . Other and unspecified hyperlipidemia   . Unspecified hypothyroidism   . Edema   . Morbid obesity    No past surgical history on file. History  Substance Use Topics  . Smoking status: Former Games developermoker  . Smokeless tobacco: Not on file     Comment: Quit in college and was not an everyday smoker  . Alcohol Use: No   Family History  Problem Relation Age of Onset  . Hypertension Maternal Grandmother   . Cancer Maternal Grandfather   . COPD Maternal Grandfather   . Stroke Paternal Grandfather   . COPD Paternal Grandfather    Allergies  Allergen Reactions  . Erythromycin     REACTION: n \\T \ v  . Penicillins     REACTION: mom has rxn  . Soy Allergy Hives   Current Outpatient Prescriptions on File Prior to Visit  Medication Sig Dispense Refill  . ADVAIR DISKUS 100-50 MCG/DOSE AEPB INHALE 1 PUFF TWO TIMES A DAY AS NEEDED 60 each 5  . SYNTHROID 150 MCG tablet Take 1 tablet (150 mcg total) by mouth daily before breakfast. 30 tablet 11   No current facility-administered medications on file prior to visit.      Review of Systems Review of Systems  Constitutional: Negative for , appetite  change,  and unexpected weight change.  ENt pos for cong/ rhinorrhea and sinus pressure  Eyes: Negative for pain and visual disturbance.  Respiratory: Negative for  shortness of breath.   Cardiovascular: Negative for cp or palpitations    Gastrointestinal: Negative for nausea, diarrhea and constipation.  Genitourinary: Negative for urgency and frequency.  Skin: Negative for pallor or rash   Neurological: Negative for weakness, light-headedness, numbness and headaches.  Hematological: Negative for adenopathy. Does not bruise/bleed easily.  Psychiatric/Behavioral: Negative for dysphoric mood. The patient is not nervous/anxious.         Objective:   Physical Exam  Constitutional: She appears well-developed and well-nourished. No distress.  obese and well appearing   HENT:  Head: Normocephalic and atraumatic.  Right Ear: External ear normal.  Left Ear: External ear normal.  Mouth/Throat: Oropharynx is clear and moist. No oropharyngeal exudate.  Nares are injected and congested   Mild maxillary sinus tenderness   Throat clear   Eyes: Conjunctivae and EOM are normal. Pupils are equal, round, and reactive to light. Right eye exhibits no discharge. Left eye exhibits no discharge.  Neck: Normal range of motion. Neck supple.  Cardiovascular: Normal rate, regular rhythm, normal heart sounds and intact distal pulses.   Pulmonary/Chest: Effort normal and breath  sounds normal. No respiratory distress. She has no wheezes. She has no rales.  Few isolated rhonchi Wheeze on forced expiration   No rales    Lymphadenopathy:    She has no cervical adenopathy.  Neurological: She is alert.  Skin: Skin is warm and dry. No rash noted.  Psychiatric: She has a normal mood and affect.          Assessment & Plan:   Problem List Items Addressed This Visit      Respiratory   Acute bronchitis - Primary    1 mo of symptoms -now with purulent phlegm and malaise  Rhonchi on exam and scant wheeze    Cover with zpak  Robitussin AC for cough as needed with caution of sedation  Fluids/rest Disc symptomatic care - see instructions on AVS Update if not starting to improve in a week or if worsening

## 2014-10-28 NOTE — Patient Instructions (Signed)
Drink lots of fluids  Get some extra rest  Take zpak as directed for bronchitis  Try robitussin with codiene for cough (watch for sedation)

## 2014-10-28 NOTE — Progress Notes (Signed)
Pre visit review using our clinic review tool, if applicable. No additional management support is needed unless otherwise documented below in the visit note. 

## 2014-10-28 NOTE — Assessment & Plan Note (Signed)
1 mo of symptoms -now with purulent phlegm and malaise  Rhonchi on exam and scant wheeze  Cover with zpak  Robitussin AC for cough as needed with caution of sedation  Fluids/rest Disc symptomatic care - see instructions on AVS Update if not starting to improve in a week or if worsening

## 2014-10-29 ENCOUNTER — Telehealth: Payer: Self-pay | Admitting: Family Medicine

## 2014-10-29 ENCOUNTER — Encounter: Payer: Self-pay | Admitting: Family Medicine

## 2014-10-29 MED ORDER — ALBUTEROL SULFATE HFA 108 (90 BASE) MCG/ACT IN AERS: 2.0000 | INHALATION_SPRAY | RESPIRATORY_TRACT | Status: DC | PRN

## 2014-10-29 NOTE — Telephone Encounter (Signed)
[facility]erol [facility]di to [employer]

## 2014-11-09 NOTE — Patient Instructions (Signed)
   Your procedure is scheduled on:  Tuesday, Dec. 1st  [facility] through the [facility] at: 6 AM Pick up the phone at the desk and dial [phone] and inform us of your arrival.  Please call this number if you have any problems the morning of surgery: [phone]  Remember: Do not eat or drink after midnight: Monday Take these medicines the morning of surgery with a SIP OF WATER:  Do not wear jewelry, make-up, or FINGER nail polish No metal in your hair or on your body. Do not wear lotions, powders, perfumes.  You may wear deodorant.  Do not bring valuables to the hospital. Contacts, dentures or bridgework may not be worn into surgery.  Leave suitcase in the car. After Surgery it may be brought to your room. For patients being admitted to the hospital, checkout time is 11:00am the day of discharge.

## 2014-11-10 ENCOUNTER — Encounter (HOSPITAL_COMMUNITY): Payer: Self-pay

## 2014-11-10 ENCOUNTER — Encounter (HOSPITAL_COMMUNITY)
Admission: RE | Admit: 2014-11-10 | Discharge: 2014-11-10 | Disposition: A | Discharge status: Home / Self Care | Payer: BC Managed Care – PPO | Source: Ambulatory Visit | Attending: Obstetrics and Gynecology | Admitting: Obstetrics and Gynecology

## 2014-11-10 DIAGNOSIS — Z01812 Encounter for preprocedural laboratory examination: Secondary | ICD-10-CM | POA: Diagnosis present

## 2014-11-10 HISTORY — DX: Bronchitis, not specified as acute or chronic: J40

## 2014-11-10 LAB — CBC
HEMATOCRIT: 40.2 % (ref 36.0–46.0)
HEMOGLOBIN: 12.5 g/dL (ref 12.0–15.0)
MCH: 25.2 pg — ABNORMAL LOW (ref 26.0–34.0)
MCHC: 31.1 g/dL (ref 30.0–36.0)
MCV: 81 fL (ref 78.0–100.0)
Platelets: 304 10*3/uL (ref 150–400)
RBC: 4.96 MIL/uL (ref 3.87–5.11)
RDW: 14.8 % (ref 11.5–15.5)
WBC: 6.8 10*3/uL (ref 4.0–10.5)

## 2014-11-10 NOTE — H&P (Addendum)
    Patient is a 38 year old, white married female, nulligravida, that returns to the office for a return GYN.  Patient does report that her last period was lighter than normal as well as she was experiencing some significant pain which did involve vomiting.  Patient does report that last 2 periods have been lighter than normal.  She and her husband have been trying to conceive and not using any form of birth control for 3 years.  Patient's husband does have a history of low sperm count, is under the care of Dr. Logan BoresEvans, however recently was told that sperm counts are normal.  Patient declines urine pregnancy test today.  O:  Height 5 feet 6-1/4 inch.  Weight 314 pounds.  Blood pressure 116/72.  Physical exam:  Abdomen soft.  Tenderness noted in the right lower quadrant.  No rebound tenderness.  No peritoneal signs.  Negative CVAT.  Positive bowel sounds.  Negative inguinal lymphadenopathy.  Vulva:  Triangular escutcheon, labia symmetrical.  Vagina is pink, no abnormal discharge.  Cervix:  Os is closed in the posterior position.  Uterus is anteverted.  Right adnexa buoyant cyst noted, large.  Left adnexa nonpalpable.    Pelvic ultrasound was performed which revealed endometrial thickness measured at 8 mm and no right ovarian tissue was seen, but there was a 19 x 19.1 x 9.6 cm with low level echo seen throughout the cyst and no blood flow was seen within the cyst.  Left ovary was seen with a thin-walled cyst measuring 4.4 cm and no blood flow seen within the cyst, but blood flow was seen to the ovary, and no free fluid seen.   A:   Right ovarian cyst probable endometrioma, left ovarian simple cyst.  P:  Discussed with patient and mother regarding ultrasound findings.  Discussed also with Dr. Rana SnareLowe.  CA-125 was drawn.  Prescription for Vicodin 5/325 #30 one p.o. every 4 hours as needed for pain.  ACOG brochure was given regarding ovarian cysts.  Will schedule laparotomy as well as chromopertubation at the time of her  surgery.  Her Ca125 returned elevated at 251 but US c/w endometrioma.  This patient has been seen and examined.   All of her questions were answered.  Labs and vital signs reviewed.  Informed consent has been obtained.  The History and Physical is current.This patient has been seen and examined.   All of her questions were answered.  Labs and vital signs reviewed.  Informed consent has been obtained.  The History and Physical is current. 11/17/14 0715 DL

## 2014-11-10 NOTE — Patient Instructions (Addendum)
   Your procedure is scheduled on:  Tuesday, Dec 1  [facility] at: 6 AM Pick up the phone at the desk and dial [phone] and inform us of your arrival.  Please call this number if you have any problems the morning of surgery: [phone]  Remember: Do not eat or drink after midnight: Monday Take these medicines the morning of surgery with a SIP OF WATER: synthroid  Do not wear jewelry, make-up, or FINGER nail polish No metal in your hair or on your body. Do not wear lotions, powders, perfumes.  You may wear deodorant.  Do not bring valuables to the hospital. Contacts, dentures or bridgework may not be worn into surgery.  Leave suitcase in the car. After Surgery it may be brought to your room. For patients being admitted to the hospital, checkout time is 11:00am the day of discharge.  Home with husband August SaucerDean  cell [phone] or mother Olegario MessierKathy cell [phone].

## 2014-11-17 ENCOUNTER — Inpatient Hospital Stay (HOSPITAL_COMMUNITY): Payer: BC Managed Care – PPO | Admitting: Anesthesiology

## 2014-11-17 ENCOUNTER — Encounter (HOSPITAL_COMMUNITY)
Admission: RE | Disposition: A | Discharge status: Home / Self Care | Payer: Self-pay | Source: Ambulatory Visit | Attending: Obstetrics and Gynecology

## 2014-11-17 ENCOUNTER — Inpatient Hospital Stay (HOSPITAL_COMMUNITY)
Admission: RE | Admit: 2014-11-17 | Discharge: 2014-11-19 | DRG: 743 | Disposition: A | Discharge status: Home / Self Care | Payer: BC Managed Care – PPO | Source: Ambulatory Visit | Attending: Obstetrics and Gynecology | Admitting: Obstetrics and Gynecology

## 2014-11-17 ENCOUNTER — Encounter (HOSPITAL_COMMUNITY): Payer: Self-pay | Admitting: *Deleted

## 2014-11-17 DIAGNOSIS — N8320 Unspecified ovarian cysts: Secondary | ICD-10-CM | POA: Diagnosis present

## 2014-11-17 DIAGNOSIS — Z9889 Other specified postprocedural states: Secondary | ICD-10-CM

## 2014-11-17 DIAGNOSIS — N809 Endometriosis, unspecified: Secondary | ICD-10-CM | POA: Diagnosis present

## 2014-11-17 DIAGNOSIS — R1110 Vomiting, unspecified: Secondary | ICD-10-CM | POA: Diagnosis present

## 2014-11-17 HISTORY — PX: OVARIAN CYST REMOVAL: SHX89

## 2014-11-17 HISTORY — PX: LYSIS OF ADHESION: SHX5961

## 2014-11-17 HISTORY — PX: LAPAROTOMY: SHX154

## 2014-11-17 LAB — PREGNANCY, URINE: Preg Test, Ur: NEGATIVE

## 2014-11-17 SURGERY — LAPAROTOMY, EXPLORATORY
Anesthesia: Monitor Anesthesia Care | Site: Abdomen | Laterality: Right

## 2014-11-17 MED ORDER — NALBUPHINE HCL 10 MG/ML IJ SOLN: 5.0000 mg | Freq: Once | INTRAMUSCULAR | Status: AC | PRN

## 2014-11-17 MED ORDER — ONDANSETRON HCL 4 MG/2ML IJ SOLN
4.0000 mg | Freq: Three times a day (TID) | INTRAMUSCULAR | Status: DC | PRN
  Administered 2014-11-18 – 2014-11-19 (×2): 4 mg via INTRAVENOUS
  Filled 2014-11-17 (×2): qty 2

## 2014-11-17 MED ORDER — ACETAMINOPHEN 160 MG/5ML PO SOLN
ORAL | Status: AC
  Administered 2014-11-17: 975 mg via ORAL
  Filled 2014-11-17: qty 40.6

## 2014-11-17 MED ORDER — ACETAMINOPHEN 160 MG/5ML PO SOLN
975.0000 mg | Freq: Once | ORAL | Status: AC
  Administered 2014-11-17: 975 mg via ORAL

## 2014-11-17 MED ORDER — PHENYLEPHRINE 40 MCG/ML (10ML) SYRINGE FOR IV PUSH (FOR BLOOD PRESSURE SUPPORT)
PREFILLED_SYRINGE | INTRAVENOUS | Status: AC
  Filled 2014-11-17: qty 5

## 2014-11-17 MED ORDER — MORPHINE SULFATE 0.5 MG/ML IJ SOLN
INTRAMUSCULAR | Status: AC
  Filled 2014-11-17: qty 10

## 2014-11-17 MED ORDER — NALBUPHINE HCL 10 MG/ML IJ SOLN
5.0000 mg | INTRAMUSCULAR | Status: DC | PRN
  Administered 2014-11-17: 5 mg via SUBCUTANEOUS

## 2014-11-17 MED ORDER — LIDOCAINE HCL (CARDIAC) 20 MG/ML IV SOLN
INTRAVENOUS | Status: AC
  Filled 2014-11-17: qty 5

## 2014-11-17 MED ORDER — DEXTROSE-NACL 5-0.45 % IV SOLN
INTRAVENOUS | Status: DC
Start: 2014-11-17 — End: 2014-11-18
  Administered 2014-11-17 (×2): via INTRAVENOUS

## 2014-11-17 MED ORDER — NALBUPHINE HCL 10 MG/ML IJ SOLN
INTRAMUSCULAR | Status: AC
  Administered 2014-11-17: 5 mg via SUBCUTANEOUS
  Filled 2014-11-17: qty 1

## 2014-11-17 MED ORDER — PROMETHAZINE HCL 25 MG/ML IJ SOLN: 6.2500 mg | INTRAMUSCULAR | Status: DC | PRN

## 2014-11-17 MED ORDER — LEVOTHYROXINE SODIUM 150 MCG PO TABS
150.0000 ug | ORAL_TABLET | Freq: Every day | ORAL | Status: DC
  Administered 2014-11-18 – 2014-11-19 (×2): 150 ug via ORAL
  Filled 2014-11-17 (×3): qty 1

## 2014-11-17 MED ORDER — INFLUENZA VAC SPLIT QUAD 0.5 ML IM SUSY
0.5000 mL | PREFILLED_SYRINGE | INTRAMUSCULAR | Status: AC
  Administered 2014-11-18: 0.5 mL via INTRAMUSCULAR
  Filled 2014-11-17: qty 0.5

## 2014-11-17 MED ORDER — KETOROLAC TROMETHAMINE 30 MG/ML IJ SOLN
INTRAMUSCULAR | Status: AC
  Administered 2014-11-17: 30 mg via INTRAVENOUS
  Filled 2014-11-17: qty 1

## 2014-11-17 MED ORDER — HEPARIN SODIUM (PORCINE) 5000 UNIT/ML IJ SOLN
INTRAMUSCULAR | Status: AC
  Filled 2014-11-17: qty 1

## 2014-11-17 MED ORDER — PROPOFOL 10 MG/ML IV BOLUS
INTRAVENOUS | Status: DC | PRN
  Administered 2014-11-17: 10 mg via INTRAVENOUS
  Administered 2014-11-17: 20 mg via INTRAVENOUS
  Administered 2014-11-17 (×2): 10 mg via INTRAVENOUS

## 2014-11-17 MED ORDER — FENTANYL CITRATE 0.05 MG/ML IJ SOLN
25.0000 ug | INTRAMUSCULAR | Status: DC | PRN
  Administered 2014-11-17 (×2): 50 ug via INTRAVENOUS

## 2014-11-17 MED ORDER — SODIUM CHLORIDE 0.9 % IJ SOLN
3.0000 mL | INTRAMUSCULAR | Status: DC | PRN
Start: 2014-11-17 — End: 2014-11-19

## 2014-11-17 MED ORDER — MEPERIDINE HCL 25 MG/ML IJ SOLN: 6.2500 mg | INTRAMUSCULAR | Status: DC | PRN

## 2014-11-17 MED ORDER — ONDANSETRON HCL 4 MG/2ML IJ SOLN
INTRAMUSCULAR | Status: AC
  Filled 2014-11-17: qty 2

## 2014-11-17 MED ORDER — FENTANYL CITRATE 0.05 MG/ML IJ SOLN
INTRAMUSCULAR | Status: AC
  Administered 2014-11-17: 50 ug via INTRAVENOUS
  Filled 2014-11-17: qty 2

## 2014-11-17 MED ORDER — METHYLENE BLUE 1 % INJ SOLN
INTRAMUSCULAR | Status: DC | PRN
Start: 2014-11-17 — End: 2014-11-17
  Administered 2014-11-17: 1 mL via SUBMUCOSAL

## 2014-11-17 MED ORDER — FENTANYL CITRATE 0.05 MG/ML IJ SOLN: 25.0000 ug | INTRAMUSCULAR | Status: DC | PRN

## 2014-11-17 MED ORDER — LACTATED RINGERS IR SOLN
Status: DC | PRN
  Administered 2014-11-17: 500 mL

## 2014-11-17 MED ORDER — MIDAZOLAM HCL 2 MG/2ML IJ SOLN
INTRAMUSCULAR | Status: AC
  Filled 2014-11-17: qty 2

## 2014-11-17 MED ORDER — LIDOCAINE-EPINEPHRINE (PF) 2 %-1:200000 IJ SOLN
INTRAMUSCULAR | Status: AC
  Filled 2014-11-17: qty 20

## 2014-11-17 MED ORDER — LIDOCAINE HCL (CARDIAC) 20 MG/ML IV SOLN
INTRAVENOUS | Status: DC | PRN
  Administered 2014-11-17: 40 mg via INTRAVENOUS

## 2014-11-17 MED ORDER — DIPHENHYDRAMINE HCL 25 MG PO CAPS
25.0000 mg | ORAL_CAPSULE | ORAL | Status: DC | PRN
  Filled 2014-11-17: qty 1

## 2014-11-17 MED ORDER — LACTATED RINGERS IV SOLN
INTRAVENOUS | Status: DC
  Administered 2014-11-17 (×3): via INTRAVENOUS

## 2014-11-17 MED ORDER — METHYLENE BLUE 1 % INJ SOLN
INTRAMUSCULAR | Status: AC
  Filled 2014-11-17: qty 1

## 2014-11-17 MED ORDER — DEXAMETHASONE SODIUM PHOSPHATE 10 MG/ML IJ SOLN
INTRAMUSCULAR | Status: DC | PRN
  Administered 2014-11-17: 4 mg via INTRAVENOUS

## 2014-11-17 MED ORDER — KETOROLAC TROMETHAMINE 30 MG/ML IJ SOLN
INTRAMUSCULAR | Status: AC
  Filled 2014-11-17: qty 1

## 2014-11-17 MED ORDER — ALBUTEROL SULFATE (2.5 MG/3ML) 0.083% IN NEBU: 3.0000 mL | INHALATION_SOLUTION | RESPIRATORY_TRACT | Status: DC | PRN

## 2014-11-17 MED ORDER — OXYCODONE-ACETAMINOPHEN 5-325 MG PO TABS
1.0000 | ORAL_TABLET | ORAL | Status: DC | PRN
  Administered 2014-11-18 – 2014-11-19 (×7): 2 via ORAL
  Filled 2014-11-17 (×7): qty 2

## 2014-11-17 MED ORDER — KETOROLAC TROMETHAMINE 30 MG/ML IJ SOLN
15.0000 mg | Freq: Once | INTRAMUSCULAR | Status: AC | PRN
  Administered 2014-11-17: 30 mg via INTRAVENOUS

## 2014-11-17 MED ORDER — SCOPOLAMINE 1 MG/3DAYS TD PT72
MEDICATED_PATCH | TRANSDERMAL | Status: AC
  Administered 2014-11-17: 1.5 mg via TRANSDERMAL
  Filled 2014-11-17: qty 1

## 2014-11-17 MED ORDER — DIPHENHYDRAMINE HCL 50 MG/ML IJ SOLN
INTRAMUSCULAR | Status: AC
  Filled 2014-11-17: qty 1

## 2014-11-17 MED ORDER — HYDROMORPHONE HCL 1 MG/ML IJ SOLN
0.2000 mg | INTRAMUSCULAR | Status: DC | PRN
  Administered 2014-11-17: 0.2 mg via INTRAVENOUS
  Filled 2014-11-17: qty 1

## 2014-11-17 MED ORDER — DEXTROSE 5 % IV SOLN
2.0000 g | INTRAVENOUS | Status: AC
  Administered 2014-11-17: 2 g via INTRAVENOUS
  Filled 2014-11-17: qty 2

## 2014-11-17 MED ORDER — ONDANSETRON HCL 4 MG/2ML IJ SOLN
INTRAMUSCULAR | Status: DC | PRN
  Administered 2014-11-17: 4 mg via INTRAVENOUS

## 2014-11-17 MED ORDER — MENTHOL 3 MG MT LOZG: 1.0000 | LOZENGE | OROMUCOSAL | Status: DC | PRN

## 2014-11-17 MED ORDER — FENTANYL CITRATE 0.05 MG/ML IJ SOLN
INTRAMUSCULAR | Status: DC | PRN
  Administered 2014-11-17: 25 ug via INTRAVENOUS
  Administered 2014-11-17: 25 ug via INTRATHECAL
  Administered 2014-11-17 (×2): 25 ug via INTRAVENOUS

## 2014-11-17 MED ORDER — MIDAZOLAM HCL 5 MG/5ML IJ SOLN
INTRAMUSCULAR | Status: DC | PRN
  Administered 2014-11-17: 2 mg via INTRAVENOUS
  Administered 2014-11-17 (×2): 1 mg via INTRAVENOUS

## 2014-11-17 MED ORDER — NALOXONE HCL 0.4 MG/ML IJ SOLN: 0.4000 mg | INTRAMUSCULAR | Status: DC | PRN

## 2014-11-17 MED ORDER — DEXAMETHASONE SODIUM PHOSPHATE 4 MG/ML IJ SOLN
INTRAMUSCULAR | Status: AC
  Filled 2014-11-17: qty 1

## 2014-11-17 MED ORDER — PROPOFOL 10 MG/ML IV EMUL
INTRAVENOUS | Status: AC
  Filled 2014-11-17: qty 20

## 2014-11-17 MED ORDER — MORPHINE SULFATE (PF) 0.5 MG/ML IJ SOLN
INTRAMUSCULAR | Status: DC | PRN
  Administered 2014-11-17: 2 mg via INTRAVENOUS
  Administered 2014-11-17: .15 mg via INTRATHECAL
  Administered 2014-11-17: 1 mg via INTRAVENOUS
  Administered 2014-11-17: 1.85 mg via EPIDURAL

## 2014-11-17 MED ORDER — NALBUPHINE HCL 10 MG/ML IJ SOLN: 5.0000 mg | INTRAMUSCULAR | Status: DC | PRN

## 2014-11-17 MED ORDER — PNEUMOCOCCAL VAC POLYVALENT 25 MCG/0.5ML IJ INJ
0.5000 mL | INJECTION | INTRAMUSCULAR | Status: DC
  Filled 2014-11-17: qty 0.5

## 2014-11-17 MED ORDER — IBUPROFEN 600 MG PO TABS: 600.0000 mg | ORAL_TABLET | Freq: Four times a day (QID) | ORAL | Status: DC | PRN

## 2014-11-17 MED ORDER — LIDOCAINE-EPINEPHRINE (PF) 2 %-1:200000 IJ SOLN
INTRAMUSCULAR | Status: DC | PRN
  Administered 2014-11-17: 3 mL

## 2014-11-17 MED ORDER — DEXTROSE 5 % IV SOLN
1.0000 ug/kg/h | INTRAVENOUS | Status: DC | PRN
  Filled 2014-11-17: qty 2

## 2014-11-17 MED ORDER — PHENYLEPHRINE HCL 10 MG/ML IJ SOLN
INTRAMUSCULAR | Status: DC | PRN
  Administered 2014-11-17: 40 ug via INTRAVENOUS

## 2014-11-17 MED ORDER — DIPHENHYDRAMINE HCL 50 MG/ML IJ SOLN
12.5000 mg | INTRAMUSCULAR | Status: DC | PRN
  Administered 2014-11-17: 12.5 mg via INTRAVENOUS
  Filled 2014-11-17: qty 1

## 2014-11-17 MED ORDER — DIPHENHYDRAMINE HCL 50 MG/ML IJ SOLN
INTRAMUSCULAR | Status: DC | PRN
  Administered 2014-11-17: 25 mg via INTRAVENOUS

## 2014-11-17 MED ORDER — BUPIVACAINE IN DEXTROSE 0.75-8.25 % IT SOLN
INTRATHECAL | Status: DC | PRN
  Administered 2014-11-17: 1.7 mL via INTRATHECAL

## 2014-11-17 MED ORDER — SCOPOLAMINE 1 MG/3DAYS TD PT72
1.0000 | MEDICATED_PATCH | Freq: Once | TRANSDERMAL | Status: DC
  Administered 2014-11-17: 1.5 mg via TRANSDERMAL

## 2014-11-17 MED ORDER — FENTANYL CITRATE 0.05 MG/ML IJ SOLN
INTRAMUSCULAR | Status: AC
  Filled 2014-11-17: qty 2

## 2014-11-17 SURGICAL SUPPLY — 30 items
BARRIER ADHS 3X4 INTERCEED (GAUZE/BANDAGES/DRESSINGS) ×1 IMPLANT
BLADE SURG 10 STRL SS (BLADE) ×8 IMPLANT
BRR ADH 4X3 ABS CNTRL BYND (GAUZE/BANDAGES/DRESSINGS) ×3
CANISTER SUCT 3000ML (MISCELLANEOUS) ×4 IMPLANT
CLOTH BEACON ORANGE TIMEOUT ST (SAFETY) ×4 IMPLANT
CONT SPECI 4OZ STER CLIK (MISCELLANEOUS) ×1 IMPLANT
CONTAINER PREFILL 10% NBF 60ML (FORM) ×1 IMPLANT
DRAPE WARM FLUID 44X44 (DRAPE) ×1 IMPLANT
DRSG OPSITE POSTOP 4X10 (GAUZE/BANDAGES/DRESSINGS) ×1 IMPLANT
GLOVE BIO SURGEON STRL SZ8 (GLOVE) ×4 IMPLANT
GLOVE SURG ORTHO 8.0 STRL STRW (GLOVE) ×4 IMPLANT
GOWN STRL REUS W/TWL LRG LVL3 (GOWN DISPOSABLE) ×8 IMPLANT
NS IRRIG 1000ML POUR BTL (IV SOLUTION) ×4 IMPLANT
PACK ABDOMINAL GYN (CUSTOM PROCEDURE TRAY) ×4 IMPLANT
PAD OB MATERNITY 4.3X12.25 (PERSONAL CARE ITEMS) ×4 IMPLANT
RTRCTR C-SECT PINK 25CM LRG (MISCELLANEOUS) ×1 IMPLANT
SPONGE LAP 18X18 X RAY DECT (DISPOSABLE) ×5 IMPLANT
STAPLER VISISTAT 35W (STAPLE) ×4 IMPLANT
SUT MNCRL AB 3-0 PS2 27 (SUTURE) ×1 IMPLANT
SUT PDS AB 0 CTX 60 (SUTURE) ×1 IMPLANT
SUT PLAIN 2 0 XLH (SUTURE) ×1 IMPLANT
SUT VIC AB 1 CTB1 27 (SUTURE) ×8 IMPLANT
SUT VIC AB 2-0 CT1 (SUTURE) ×4 IMPLANT
SUT VIC AB 2-0 SH 27 (SUTURE) ×4
SUT VIC AB 2-0 SH 27XBRD (SUTURE) IMPLANT
SUT VICRYL 0 TIES 12 18 (SUTURE) ×4 IMPLANT
SYR BULB IRRIGATION 50ML (SYRINGE) ×1 IMPLANT
TOWEL OR 17X24 6PK STRL BLUE (TOWEL DISPOSABLE) ×8 IMPLANT
TRAY FOLEY CATH 14FR (SET/KITS/TRAYS/PACK) ×4 IMPLANT
WATER STERILE IRR 1000ML POUR (IV SOLUTION) ×4 IMPLANT

## 2014-11-17 NOTE — Brief Op Note (Signed)
11/17/2014  9:15 AM  PATIENT:  Nancy Dunlap  38 y.o. female  PRE-OPERATIVE DIAGNOSIS:  19cm right ovarian cyst, 5 cm left ovarian cyst, pelvic pain  POST-OPERATIVE DIAGNOSIS:  Same plus stage 4 endometriosis  PROCEDURE:  Procedure(s): EXPLORATORY LAPAROTOMY, chromopertubation (N/A) LYSIS OF ADHESION (N/A) OVARIAN CYSTECTOMY (Right)  SURGEON:  Surgeon(s) and Role:    * Turner Danielsavid C Livy Ross, MD - Primary    * Meriel Picaichard M Holland, MD - Assisting  PHYSICIAN ASSISTANT:   ASSISTANTS: Holland   ANESTHESIA:   epidural  EBL:  Total I/O In: 2200 [I.V.:2200] Out: 250 [Urine:100; Blood:150]  BLOOD ADMINISTERED:none  DRAINS: Urinary Catheter (Foley)   LOCAL MEDICATIONS USED:  NONE  SPECIMEN:  Source of Specimen:  bilateral ovarian cyst walls  DISPOSITION OF SPECIMEN:  PATHOLOGY  COUNTS:  YES  TOURNIQUET:  * No tourniquets in log *  DICTATION: .Other Dictation: Dictation Number 1  PLAN OF CARE: Admit to inpatient   PATIENT DISPOSITION:  PACU - hemodynamically stable.   Delay start of Pharmacological VTE agent (>24hrs) due to surgical blood loss or risk of bleeding: not applicable

## 2014-11-17 NOTE — Anesthesia Postprocedure Evaluation (Signed)
  Anesthesia Post-op Note  Patient: Nancy Dunlap  Procedure(s) Performed: Procedure(s): EXPLORATORY LAPAROTOMY, chromopertubation (N/A) LYSIS OF ADHESION (N/A) OVARIAN CYSTECTOMY (Right)  Patient Location: PACU  Anesthesia Type:Spinal  Level of Consciousness: awake, alert  and oriented  Airway and Oxygen Therapy: Patient Spontanous Breathing  Post-op Pain: none  Post-op Assessment: Post-op Vital signs reviewed, Patient's Cardiovascular Status Stable, Respiratory Function Stable, Patent Airway, No signs of Nausea or vomiting, Pain level controlled, No headache, No backache, No residual numbness and No residual motor weakness  Post-op Vital Signs: Reviewed and stable  Last Vitals:  Filed Vitals:   11/17/14 1030  BP:   Pulse: 63  Temp: 36.6 C  Resp: 12    Complications: No apparent anesthesia complications

## 2014-11-17 NOTE — Anesthesia Procedure Notes (Signed)
Spinal Patient location during procedure: OR Start time: 11/17/2014 7:33 AM Staffing Anesthesiologist: Inessa Wardrop A. Performed by: anesthesiologist  Preanesthetic Checklist Completed: patient identified, site marked, surgical consent, pre-op evaluation, timeout performed, IV checked, risks and benefits discussed and monitors and equipment checked Spinal Block Patient position: sitting Prep: site prepped and draped and DuraPrep Patient monitoring: cardiac monitor, continuous pulse ox, blood pressure and heart rate Approach: midline Location: L3-4 Injection technique: catheter Needle Needle type: Tuohy and Sprotte  Needle gauge: 24 G Needle length: 12.7 cm Needle insertion depth: 9 cm Catheter type: closed end flexible Catheter size: 19 g Assessment Sensory level: T4 Additional Notes Epidural performed using 17ga Touhy. SAB performed through epidural needle. CSF clear, free flow, no heme or paresthesias. Epidural catheter threaded. Sterile dressing applied. Patient placed supine. Patient tolerated procedure well. Adequate sensory level.

## 2014-11-17 NOTE — Anesthesia Postprocedure Evaluation (Signed)
  Anesthesia Post-op Note  Patient: [name]  Procedure(s) Performed: Procedure(s): EXPLORATORY LAPAROTOMY, chromopertubation (N/A) LYSIS OF ADHESION (N/A) OVARIAN CYSTECTOMY (Right)  Patient Location: [facility]  Anesthesia Type:Spinal and Epidural  Level of Consciousness: awake, alert  and oriented  Airway and Oxygen Therapy: Patient Spontanous Breathing and Patient connected to nasal cannula oxygen  Post-op Pain: mild  Post-op Assessment: Post-op Vital signs reviewed  Post-op Vital Signs: Reviewed and stable  Last Vitals:  Filed Vitals:   11/17/14 1630  BP: 126/63  Pulse: 74  Temp:   Resp: 14    Complications: No apparent anesthesia complications

## 2014-11-17 NOTE — Transfer of Care (Signed)
Immediate Anesthesia Transfer of Care Note  Patient: Nancy Dunlap  Procedure(s) Performed: Procedure(s): EXPLORATORY LAPAROTOMY, chromopertubation (N/A) LYSIS OF ADHESION (N/A) OVARIAN CYSTECTOMY (Right)  Patient Location: PACU  Anesthesia Type:MAC, Spinal and Epidural  Level of Consciousness: awake, alert , oriented and patient cooperative  Airway & Oxygen Therapy: Patient Spontanous Breathing and Patient connected to nasal cannula oxygen  Post-op Assessment: Report given to PACU RN and Post -op Vital signs reviewed and stable  Post vital signs: Reviewed and stable  Complications: No apparent anesthesia complications

## 2014-11-17 NOTE — Anesthesia Preprocedure Evaluation (Signed)
Anesthesia Evaluation  Patient identified by MRN, date of birth, ID band Patient awake    Reviewed: Allergy & Precautions, H&P , NPO status , Patient's Chart, lab work & pertinent test results, reviewed documented beta blocker date and time   History of Anesthesia Complications Negative for: history of anesthetic complications  Airway Mallampati: II       Dental  (+) Teeth Intact   Pulmonary Recent URI  (bronchitis first of Nov - pt now with occasional dry cough. took z-pack.  never fever.  not wheezing), former smoker,  breath sounds clear to auscultation  Pulmonary exam normal       Cardiovascular Exercise Tolerance: Good negative cardio ROS  Rhythm:regular Rate:Normal     Neuro/Psych negative neurological ROS  negative psych ROS   GI/Hepatic negative GI ROS, Neg liver ROS,   Endo/Other  Hypothyroidism Morbid obesity  Renal/GU negative Renal ROS  Female GU complaint     Musculoskeletal   Abdominal   Peds  Hematology negative hematology ROS (+)   Anesthesia Other Findings   Reproductive/Obstetrics negative OB ROS                             Anesthesia Physical Anesthesia Plan  ASA: III  Anesthesia Plan: Combined Spinal and Epidural and MAC   Post-op Pain Management:    Induction:   Airway Management Planned:   Additional Equipment:   Intra-op Plan:   Post-operative Plan:   Informed Consent: I have reviewed the patients History and Physical, chart, labs and discussed the procedure including the risks, benefits and alternatives for the proposed anesthesia with the patient or authorized representative who has indicated his/her understanding and acceptance.   Dental Advisory Given  Plan Discussed with: CRNA and Surgeon  Anesthesia Plan Comments:         Anesthesia Quick Evaluation

## 2014-11-17 NOTE — Addendum Note (Signed)
Addendum  created 11/17/14 1634 by Armanda Heritageharlesetta M Aariona Momon, CRNA   Modules edited: Notes Section   Notes Section:  File: [id number]

## 2014-11-18 ENCOUNTER — Encounter (HOSPITAL_COMMUNITY): Payer: Self-pay | Admitting: Obstetrics and Gynecology

## 2014-11-18 LAB — CBC
HCT: 34.3 % — ABNORMAL LOW (ref 36.0–46.0)
HEMOGLOBIN: 10.5 g/dL — AB (ref 12.0–15.0)
MCH: 25.1 pg — AB (ref 26.0–34.0)
MCHC: 30.6 g/dL (ref 30.0–36.0)
MCV: 81.9 fL (ref 78.0–100.0)
Platelets: 231 10*3/uL (ref 150–400)
RBC: 4.19 MIL/uL (ref 3.87–5.11)
RDW: 15.2 % (ref 11.5–15.5)
WBC: 8 10*3/uL (ref 4.0–10.5)

## 2014-11-18 MED ORDER — LEUPROLIDE ACETATE 3.75 MG IM KIT
3.7500 mg | PACK | Freq: Once | INTRAMUSCULAR | Status: AC
  Administered 2014-11-18: 3.75 mg via INTRAMUSCULAR
  Filled 2014-11-18: qty 3.75

## 2014-11-18 MED ORDER — SIMETHICONE 80 MG PO CHEW
80.0000 mg | CHEWABLE_TABLET | Freq: Four times a day (QID) | ORAL | Status: DC | PRN
  Administered 2014-11-18: 80 mg via ORAL
  Filled 2014-11-18: qty 1

## 2014-11-18 MED ORDER — PNEUMOCOCCAL VAC POLYVALENT 25 MCG/0.5ML IJ INJ
0.5000 mL | INJECTION | INTRAMUSCULAR | Status: AC
  Administered 2014-11-19: 0.5 mL via INTRAMUSCULAR
  Filled 2014-11-18: qty 0.5

## 2014-11-18 NOTE — Progress Notes (Signed)
1 Day Post-Op Procedure(s) (LRB): EXPLORATORY LAPAROTOMY, LYSIS OF ADHESION (N/A) OVARIAN CYSTECTOMY Bilateral for severe endometriosis  Subjective: Patient reports tolerating PO and + flatus.  Pain well controlled after epidural with duramorph and now with motrin and Tylox  Objective: I have reviewed patient's vital signs, intake and output, medications and labs.  General: alert, cooperative, appears stated age and no distress GI: soft, non-tender; bowel sounds normal; no masses,  no organomegaly and incision: clean, dry and intact  Assessment: s/p Procedure(s): EXPLORATORY LAPAROTOMY, chromopertubation (N/A) LYSIS OF ADHESION (N/A) OVARIAN CYSTECTOMY (Right): stable  Plan: Advance diet Encourage ambulation Advance to PO medication Discontinue IV fluids  Will begin Depo lupron today for severe endometriosis Anticipate DC tomorrow am  LOS: 1 day    Eriverto Byrnes C 11/18/2014, 8:17 AM

## 2014-11-18 NOTE — Op Note (Signed)
NAMChristophe Dunlap:  Nancy Dunlap, Nancy Dunlap             ACCOUNT NO.:  [id number]  MEDICAL RECORD NO.:  [id number]  LOCATION:  9316                          FACILITY:  [facility]  PHYSICIAN:  Dineen Kidavid C. Rana SnareLowe, M.D.    DATE OF BIRTH:  09/29/1976  DATE OF PROCEDURE:  11/17/2014 DATE OF DISCHARGE:                              OPERATIVE REPORT   PREOPERATIVE DIAGNOSES:  Large adnexal mass 19 cm on the right.  Left adnexal mass 4.5 cm.  Pelvic pain.  POSTOPERATIVE DIAGNOSES:  Large adnexal mass 19 cm on the right.  Left adnexal mass 4.5 cm.  Pelvic pain plus stage IV endometriosis with bilateral endometriomas.  SURGEON:  Dineen Kidavid C. Rana SnareLowe, M.D.  ASSISTANT:  Duke Salviaichard M. Marcelle OverlieHolland, M.D.  PROCEDURE:  Laparotomy with bilateral ovarian cystectomies, lysis of adhesions.  ANESTHESIA:  Epidural.  INDICATIONS:  Ms. Nancy Dunlap is a 38 year old nulligravida white female with worsening onset of pelvic pain over the last 3-6 months, more severe in the last several weeks, presented to my office to see my nurse practitioner.  We had an ultrasound showing a large 19 cm cyst on the right side, 4.5 cm on the left, elevated CA 125.  The ultrasound was mostly suspicious for endometriomas.  We discussed surgical options. She wanted to proceed with preservation of the ovaries if at all possible.  Discussed laparotomy evaluation and treatment option. Discussed all the different scenarios, if they were cancer, precancer, recurrence risks and risks associated with surgery including but not limited to risk of infection, bleeding; damage to uterus, tubes, ovary, bowel, bladder; possible recurrence, possible may not improve fertility, may not improve her pain.  It could recur or worsen, also injury to adjacent organs.  She gives informed consent and wished to proceed.  FINDINGS:  At the time of surgery, large 19 cm endometrioma of the right ovary, 4.5 cm endometrioma of the left ovary, and also a smaller 2-to 3- cm endometrioma on the left ovary  as well.  Appeared to be a hydrosalpinx in the left fallopian tube.  The right fallopian tube was somewhat contoured and unable to assess due to the distention due to large right ovary.  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  DESCRIPTION OF PROCEDURE:  After adequate analgesia, the patient was placed in the supine position.  She was sterilely prepped and draped. An attempt to place the cohen tenaculum was unsuccessful due to the fact that she was not in stirrups.  We proceeded with a Pfannenstiel skin incision and taken down sharply.  Fascia was incised transversely, extended superiorly and inferiorly off the rectus muscles were separated sharply in the midline.  Peritoneum was entered sharply.  The bowel was packed cephalad.  An Alexis retractor was placed and the above findings were noted.  After careful and systematic evaluation of the pelvis, we were not able to elevate the right ovary into the surgical field.  We did pack around the area and a small amount of leakage was noted consistent with trauma, at which time, we did rupture the endometrioma with suction and irrigation to suction out the cyst  And proceeded with removal of the endometriotic wall of the cyst inside the Ovary.  The entire cyst wall including some of the ovarian tissue along the top.  This was taken down to the base of the endometrioma and was completely excised and was then closed with a figure-of-eight 0 Vicryl suture and after good hemostasis was achieved, the exterior of the ovary was then closed with a 2-0 Vicryl running suture with good approximation.  Good hemostasis was noted.  The left ovary was freed from the back wall of the uterus with blunt dissection and small Bowel dissected from this also again with blunt dissection with good hemostasis and care taken to avoid injury to the bowel.  A Bovie was used  to open the endometrioma.  It was drained with suction irrigation.  Cyst wall was extracted.  The base was then closed in a similar fashion with 0 Vicryl suture and 2-0 Vicryl suture on the surface.  Meticulous irrigation had been carried out throughout after draining of the ovaries and removal of the walls and before the closure. Copious irrigation had been applied.  At this point, good hemostasis was achieved bilaterally.  Ovaries appeared to be fairly normal as well as shape and contour with no evidence of remaining endometriomas were noted.  Because of the severity of the frozen posterior wall of the uterus to the bowel, this part was left alone. After carefull evaluation of the pelvis,  No evidence of bowel or bladder injury was noted and good hemostasis had been achieved. Ureters were palpated and felt to be well away from any disection.  Interceed adhesion barrier was placed over the ovarian cystectomy areas.  Packing was then removed.  The retractor was removed.  The peritoneum was closed with 2-0 Vicryl suture.  Rectus muscle plicated in the midline.  The fascia was then closed with a double-stranded PDS 0 in a running fashion with good approximation and hemostasis.  The Camper fascia was then closed with plain gut suture.  The skin was then closed with 3-0 Monocryl in a subcuticular fashion.  The  patient tolerated the procedure well, was stable, and transferred to recovery room.  Sponge and instrument counts correct x3.  ESTIMATED BLOOD LOSS:  150 mL.  The patient received 2 g of cefotetan preoperatively.     Dineen Kidavid C. Rana SnareLowe, M.D.     DCL/MEDQ  D:  11/17/2014  T:  11/18/2014  Job:  [id number]

## 2014-11-19 MED ORDER — METHOCARBAMOL 500 MG PO TABS: 500.0000 mg | ORAL_TABLET | Freq: Four times a day (QID) | ORAL | Status: DC | PRN

## 2014-11-19 MED ORDER — IBUPROFEN 600 MG PO TABS: 600.0000 mg | ORAL_TABLET | Freq: Four times a day (QID) | ORAL | Status: DC | PRN

## 2014-11-19 MED ORDER — BISACODYL 10 MG RE SUPP
10.0000 mg | Freq: Once | RECTAL | Status: AC
  Administered 2014-11-19: 10 mg via RECTAL
  Filled 2014-11-19: qty 1

## 2014-11-19 NOTE — Discharge Summary (Signed)
Physician Discharge Summary  Patient ID: Nancy LemonsJennifer R Dunlap MRN: [id number] DOB/AGE: 37/06/1976 37 y.o.  Admit date: 11/17/2014 Discharge date: 11/19/2014  Admission Diagnoses:Ovarian Cysts   Discharge Diagnoses: Severe Endometriosis Active Problems:   S/P laparotomy   Discharged Condition: good  Hospital Course: Good recovery of bowel / bladder function, good pain relief, ambulating , tolerating regular diet, voiding.  Consults: None  Significant Diagnostic Studies: labs:  Results for orders placed or performed during the hospital encounter of 11/17/14 (from the past 48 hour(s))  CBC     Status: Abnormal   Collection Time: 11/18/14  5:39 AM  Result Value Ref Range   WBC 8.0 4.0 - 10.5 K/uL   RBC 4.19 3.87 - 5.11 MIL/uL   Hemoglobin 10.5 (L) 12.0 - 15.0 g/dL   HCT 82.934.3 (L) 56.236.0 - 13.046.0 %   MCV 81.9 78.0 - 100.0 fL   MCH 25.1 (L) 26.0 - 34.0 pg   MCHC 30.6 30.0 - 36.0 g/dL   RDW 86.515.2 78.411.5 - 69.615.5 %   Platelets 231 150 - 400 K/uL    Treatments: surgery: Laparotomy with bilateral ovarian cystectomy and lysis of adhesions  Discharge Exam: Blood pressure 109/49, pulse 93, temperature 99.1 F (37.3 C), temperature source Oral, resp. rate 18, height 5\' 6"  (1.676 m), weight 315 lb (142.883 kg), last menstrual period 10/28/2014, SpO2 100 %. General appearance: alert, cooperative and no distress GI: soft, non-tender; bowel sounds normal; no masses,  no organomegaly  Disposition: Final discharge disposition not confirmed     Medication List    ASK your doctor about these medications        ADVAIR DISKUS 100-50 MCG/DOSE Aepb  Generic drug:  Fluticasone-Salmeterol  INHALE 1 PUFF TWO TIMES A DAY AS NEEDED     albuterol 108 (90 BASE) MCG/ACT inhaler  Commonly known as:  PROVENTIL HFA;VENTOLIN HFA  Inhale 2 puffs into the lungs every 4 (four) hours as needed for wheezing.     azithromycin 250 MG tablet  Commonly known as:  ZITHROMAX Z-PAK  Take 2 pills by mouth today and  then 1 pill daily for 4 days     guaiFENesin-codeine 100-10 MG/5ML syrup  Commonly known as:  ROBITUSSIN AC  Take 5 mLs by mouth 3 (three) times daily as needed for cough.     SYNTHROID 150 MCG tablet  Generic drug:  levothyroxine  Take 1 tablet (150 mcg total) by mouth daily before breakfast.         Signed: Sharnita Bogucki II,Cleofas Hudgins E 11/19/2014, 9:30 AM

## 2014-11-19 NOTE — Progress Notes (Signed)
Pt ambulating in hallway... States she was able to pass flatus and have a bowel movement.

## 2014-11-19 NOTE — Progress Notes (Signed)
Pt discharged home with husband... Discharge instructions reviewed with pt and she verbalized understanding... Condition stable... No ewuipment... Taken to car via wheelchair by Selinda MichaelsE. Moses Odoherty, RN.

## 2014-11-19 NOTE — Plan of Care (Signed)
Problem: Discharge Progression Outcomes Goal: Discharge plan in place and appropriate Outcome: Completed/Met Date Met:  11/19/14 Goal: Pain controlled with appropriate interventions Outcome: Completed/Met Date Met:  18/86/77 Goal: Complications resolved/controlled Outcome: Not Applicable Date Met:  37/36/68 Goal: Tolerating diet Outcome: Completed/Met Date Met:  11/19/14

## 2014-11-19 NOTE — Progress Notes (Signed)
Tolerating regular diet, no flatus yet, voiding, ambulating  VSS Afeb  Abd soft, good BS   Dressing OK   Ext PAS on  A/P: Will D/C home after flatus         Instructions reviewed

## 2014-11-26 ENCOUNTER — Encounter: Payer: Self-pay | Admitting: Family Medicine

## 2014-11-27 ENCOUNTER — Telehealth: Payer: Self-pay | Admitting: Family Medicine

## 2014-11-27 MED ORDER — BENZONATATE 200 MG PO CAPS: 200.0000 mg | ORAL_CAPSULE | Freq: Three times a day (TID) | ORAL | Status: DC | PRN

## 2014-11-27 NOTE — Telephone Encounter (Signed)
Sending in tessalon for cough

## 2014-12-02 ENCOUNTER — Ambulatory Visit (INDEPENDENT_AMBULATORY_CARE_PROVIDER_SITE_OTHER): Payer: BC Managed Care – PPO | Admitting: Family Medicine

## 2014-12-02 ENCOUNTER — Ambulatory Visit (INDEPENDENT_AMBULATORY_CARE_PROVIDER_SITE_OTHER)
Admission: RE | Admit: 2014-12-02 | Discharge: 2014-12-02 | Disposition: A | Discharge status: Home / Self Care | Payer: BC Managed Care – PPO | Source: Ambulatory Visit | Attending: Family Medicine | Admitting: Family Medicine

## 2014-12-02 ENCOUNTER — Encounter: Payer: Self-pay | Admitting: Family Medicine

## 2014-12-02 VITALS — BP 108/64 | HR 99 | Temp 97.7°F | Ht 66.0 in | Wt 310.5 lb

## 2014-12-02 DIAGNOSIS — J209 Acute bronchitis, unspecified: Secondary | ICD-10-CM

## 2014-12-02 MED ORDER — LEVOFLOXACIN 500 MG PO TABS: 500.0000 mg | ORAL_TABLET | Freq: Every day | ORAL | Status: DC

## 2014-12-02 NOTE — Progress Notes (Signed)
Pre visit review using our clinic review tool, if applicable. No additional management support is needed unless otherwise documented below in the visit note. 

## 2014-12-02 NOTE — Progress Notes (Signed)
Subjective:    Patient ID: Nancy Dunlap, female    DOB: 11/18/1976, 38 y.o.   MRN: [id number]  HPI Here with bronchitis symptoms   Had surgery in the meantime   Cough came back after prev tx  Saw Dr Rana SnareLowe yesterday  Coughing up colored phlegm  Side hurts her when she coughs  Also wheezing - a lot but not sob Using her inhalers -- advair and proventil    Just removed an ovarian cyst  Not pregnant  Stage 4 endometriosis  Given lupron  -hormones are crazy right now - much fatigue   Patient Active Problem List   Diagnosis Date Noted  . S/P laparotomy 11/17/2014  . Acute bronchitis 10/28/2014  . Amenorrhea 03/17/2011  . Routine general medical examination at a health care facility 03/16/2011  . HYPERLIPIDEMIA 01/03/2010  . HYPOTHYROIDISM 10/07/2007  . OBESITY, MORBID 09/30/2007  . LEG EDEMA 09/30/2007   Past Medical History  Diagnosis Date  . Other and unspecified hyperlipidemia     no meds  . Unspecified hypothyroidism   . Edema   . Morbid obesity   . Bronchitis     just finished abx  11/01/14, has inhaler for bronchitis tx   Past Surgical History  Procedure Laterality Date  . 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 Wisdom tooth extraction    . Laparotomy N/A 11/17/2014    Procedure: EXPLORATORY LAPAROTOMY, chromopertubation;  Surgeon: Turner Danielsavid C Lowe, MD;  Location: [facility];  Service: Gynecology;  Laterality: N/A;  . Lysis of adhesion N/A 11/17/2014    Procedure: LYSIS OF ADHESION;  Surgeon: Turner Danielsavid C Lowe, MD;  Location: [facility];  Service: Gynecology;  Laterality: N/A;  . Ovarian cyst removal Right 11/17/2014    Procedure: OVARIAN CYSTECTOMY;  Surgeon: Turner Danielsavid C Lowe, MD;  Location: [facility];  Service: Gynecology;  Laterality: Right;   History  Substance Use Topics  . Smoking status: Former Smoker -- 0.25 packs/day for 4 years    Types: Cigarettes    Quit date: 12/18/1998  . Smokeless tobacco: Never Used     Comment: Quit in college and was not an everyday smoker  .  Alcohol Use: 0.0 oz/week    0 Not specified per week     Comment: socially   Family History  Problem Relation Age of Onset  . Hypertension Maternal Grandmother   . Cancer Maternal Grandfather   . COPD Maternal Grandfather   . Stroke Paternal Grandfather   . COPD Paternal Grandfather    Allergies  Allergen Reactions  . Erythromycin Nausea And Vomiting    REACTION: n \\T \ v  . Penicillins     REACTION: mom has rxn Patient has never taken PCN Her mom can take cephalosporins without problems  . Soy Allergy Hives   Current Outpatient Prescriptions on File Prior to Visit  Medication Sig Dispense Refill  . ADVAIR DISKUS 100-50 MCG/DOSE AEPB INHALE 1 PUFF TWO TIMES A DAY AS NEEDED 60 each 5  . albuterol (PROVENTIL HFA;VENTOLIN HFA) 108 (90 BASE) MCG/ACT inhaler Inhale 2 puffs into the lungs every 4 (four) hours as needed for wheezing. 1 Inhaler 3  . benzonatate (TESSALON) 200 MG capsule Take 1 capsule (200 mg total) by mouth 3 (three) times daily as needed for cough (swallow whole). 30 capsule 0  . ibuprofen (ADVIL,MOTRIN) 600 MG tablet Take 1 tablet (600 mg total) by mouth every 6 (six) hours as needed (mild pain). 30 tablet 0  .  methocarbamol (ROBAXIN) 500 MG tablet Take 1 tablet (500 mg total) by mouth every 6 (six) hours as needed for muscle spasms. 30 tablet 0  . SYNTHROID 150 MCG tablet Take 1 tablet (150 mcg total) by mouth daily before breakfast. 30 tablet 11   No current facility-administered medications on file prior to visit.      Review of Systems Review of Systems  Constitutional: Negative for fever, appetite change,  and unexpected weight change.  ENT pos for cong/ rhinorrhea and neg for facial pain  Eyes: Negative for pain and visual disturbance.  Respiratory: Negative for  shortness of breath.  pos for prod cough and wheeze  Cardiovascular: Negative for cp or palpitations    Gastrointestinal: Negative for nausea, diarrhea and constipation.  Genitourinary: Negative  for urgency and frequency.  Skin: Negative for pallor or rash   Neurological: Negative for weakness, light-headedness, numbness and headaches.  Hematological: Negative for adenopathy. Does not bruise/bleed easily.  Psychiatric/Behavioral: Negative for dysphoric mood. The patient is not nervous/anxious.         Objective:   Physical Exam  Constitutional: She appears well-developed and well-nourished. No distress.  HENT:  Head: Normocephalic and atraumatic.  Right Ear: External ear normal.  Left Ear: External ear normal.  Mouth/Throat: Oropharynx is clear and moist.  Nares are boggy No sinus tenderness   Eyes: Conjunctivae and EOM are normal. Pupils are equal, round, and reactive to light. Right eye exhibits no discharge. Left eye exhibits no discharge. No scleral icterus.  Neck: Normal range of motion. Neck supple.  Pulmonary/Chest: Effort normal. No respiratory distress. She has wheezes. She has no rales. She exhibits no tenderness.  Harsh bs with occ rhonchi   Diffuse mild wheezes without prolonged exp phase   No rales  Fair air exch  Musculoskeletal: She exhibits no edema or tenderness.  Lymphadenopathy:    She has no cervical adenopathy.  Neurological: She is alert. She has normal reflexes. No cranial nerve deficit. She exhibits normal muscle tone. Coordination normal.  Skin: Skin is warm and dry. No rash noted. No pallor.  Psychiatric: She has a normal mood and affect.          Assessment & Plan:   Problem List Items Addressed This Visit      Respiratory   Acute bronchitis - Primary    ? Unclear if new or from the last episode Did recently have surgery Cover with augmentin and continue inhalers cxr today    Relevant Orders      DG Chest 2 View (Completed)

## 2014-12-02 NOTE — Patient Instructions (Signed)
Chest xray today  Drink lots of fluids Start levaquin as directed  Continue inhalers  We will get back to you with results

## 2014-12-02 NOTE — Assessment & Plan Note (Signed)
?   Unclear if new or from the last episode Did recently have surgery Cover with augmentin and continue inhalers cxr today

## 2014-12-14 ENCOUNTER — Other Ambulatory Visit: Payer: Self-pay | Admitting: Family Medicine

## 2014-12-14 NOTE — Telephone Encounter (Signed)
done

## 2014-12-14 NOTE — Telephone Encounter (Signed)
Electronic refill request, please advise  

## 2014-12-14 NOTE — Telephone Encounter (Signed)
Please refill times one  

## 2014-12-25 ENCOUNTER — Other Ambulatory Visit (HOSPITAL_COMMUNITY): Payer: Self-pay | Admitting: Obstetrics and Gynecology

## 2014-12-25 DIAGNOSIS — N8000 Endometriosis of the uterus, unspecified: Secondary | ICD-10-CM

## 2015-02-17 ENCOUNTER — Ambulatory Visit (HOSPITAL_COMMUNITY)
Admission: RE | Admit: 2015-02-17 | Discharge: 2015-02-17 | Disposition: A | Discharge status: Home / Self Care | Payer: BLUE CROSS/BLUE SHIELD | Source: Ambulatory Visit | Attending: Obstetrics and Gynecology | Admitting: Obstetrics and Gynecology

## 2015-02-17 DIAGNOSIS — N7011 Chronic salpingitis: Secondary | ICD-10-CM | POA: Diagnosis not present

## 2015-02-17 DIAGNOSIS — N979 Female infertility, unspecified: Secondary | ICD-10-CM | POA: Insufficient documentation

## 2015-02-17 DIAGNOSIS — N8000 Endometriosis of the uterus, unspecified: Secondary | ICD-10-CM

## 2015-02-17 DIAGNOSIS — Q51818 Other congenital malformations of uterus: Secondary | ICD-10-CM | POA: Insufficient documentation

## 2015-02-17 DIAGNOSIS — N971 Female infertility of tubal origin: Secondary | ICD-10-CM | POA: Insufficient documentation

## 2015-02-17 DIAGNOSIS — N809 Endometriosis, unspecified: Secondary | ICD-10-CM | POA: Diagnosis not present

## 2015-02-17 MED ORDER — IOHEXOL 300 MG/ML  SOLN
20.0000 mL | Freq: Once | INTRAMUSCULAR | Status: AC | PRN
  Administered 2015-02-17: 20 mL

## 2015-02-25 ENCOUNTER — Ambulatory Visit (HOSPITAL_COMMUNITY): Payer: Self-pay

## 2015-03-17 ENCOUNTER — Other Ambulatory Visit: Payer: Self-pay | Admitting: Family Medicine

## 2015-04-07 ENCOUNTER — Other Ambulatory Visit: Payer: Self-pay | Admitting: Family Medicine

## 2015-04-07 NOTE — Telephone Encounter (Signed)
Please call and ask her about that -thanks

## 2015-04-07 NOTE — Telephone Encounter (Signed)
Electronic refill request, last TSH lab on file was drawn on 12/08/13, but there is a note at a recent OV saying that pt's gyn monitors TSH, please advise

## 2015-04-08 NOTE — Telephone Encounter (Signed)
Spoke with pt and she said she had a TSH done recently at [facility]. I called that office and they didn't have a recent TSH, the last one they did was back in Dec 2014, I called pt and let her know but she is sure that she has had a recent TSH level so she is going to call her fertility doctor and see if she had it done there. I gave pt my fax # and she will have them fax over the lab results if they have done one, if not she will call back and schedule a lab appt to have it checked

## 2015-04-13 NOTE — Telephone Encounter (Signed)
Med refilled x 1 month and pharmacy advise pt need TSH lab

## 2015-05-06 ENCOUNTER — Telehealth: Payer: Self-pay | Admitting: Family Medicine

## 2015-05-06 NOTE — Telephone Encounter (Signed)
Yes-please f/u with me after labs - June  Thanks

## 2015-05-06 NOTE — Telephone Encounter (Signed)
Pt scheduled lab appt for thyroid and wasn't sure if she needed a follow up with Dr Milinda Antisower.  Can you please let me know if she needs one so I can call her? Thanks.

## 2015-05-07 NOTE — Telephone Encounter (Signed)
Scheduled for 6/3  

## 2015-05-07 NOTE — Telephone Encounter (Signed)
Lm for pt to call back

## 2015-05-17 ENCOUNTER — Telehealth: Payer: Self-pay | Admitting: Family Medicine

## 2015-05-17 DIAGNOSIS — E785 Hyperlipidemia, unspecified: Secondary | ICD-10-CM

## 2015-05-17 DIAGNOSIS — E039 Hypothyroidism, unspecified: Secondary | ICD-10-CM

## 2015-05-17 NOTE — Telephone Encounter (Signed)
-----   Message from Baldomero LamyNatasha C Chavers sent at 05/13/2015 11:06 AM EDT ----- Regarding: f/u labs Tues 5/31, need orders please :-) Please order  future f/u ( thyroid?)labs for pt's upcoming lab appt. Thanks Rodney Boozeasha

## 2015-05-18 ENCOUNTER — Other Ambulatory Visit: Payer: BLUE CROSS/BLUE SHIELD

## 2015-05-18 ENCOUNTER — Other Ambulatory Visit (INDEPENDENT_AMBULATORY_CARE_PROVIDER_SITE_OTHER): Payer: BLUE CROSS/BLUE SHIELD

## 2015-05-18 DIAGNOSIS — E039 Hypothyroidism, unspecified: Secondary | ICD-10-CM

## 2015-05-18 DIAGNOSIS — E785 Hyperlipidemia, unspecified: Secondary | ICD-10-CM | POA: Diagnosis not present

## 2015-05-18 LAB — TSH: TSH: 1.68 u[IU]/mL (ref 0.35–4.50)

## 2015-05-18 LAB — LIPID PANEL
CHOLESTEROL: 169 mg/dL (ref 0–200)
HDL: 47.3 mg/dL (ref >39.00)
LDL Cholesterol: 101 mg/dL — ABNORMAL HIGH (ref 0–99)
NONHDL: 121.7
Total CHOL/HDL Ratio: 4
Triglycerides: 102 mg/dL (ref 0.0–149.0)
VLDL: 20.4 mg/dL (ref 0.0–40.0)

## 2015-05-18 LAB — COMPREHENSIVE METABOLIC PANEL
ALBUMIN: 3.8 g/dL (ref 3.5–5.2)
ALT: 29 U/L (ref 0–35)
AST: 19 U/L (ref 0–37)
Alkaline Phosphatase: 63 U/L (ref 39–117)
BILIRUBIN TOTAL: 0.3 mg/dL (ref 0.2–1.2)
BUN: 12 mg/dL (ref 6–23)
CALCIUM: 9.1 mg/dL (ref 8.4–10.5)
CO2: 27 meq/L (ref 19–32)
Chloride: 107 mEq/L (ref 96–112)
Creatinine, Ser: 0.89 mg/dL (ref 0.40–1.20)
GFR: 75.62 mL/min (ref >60.00)
GLUCOSE: 91 mg/dL (ref 70–99)
POTASSIUM: 3.9 meq/L (ref 3.5–5.1)
Sodium: 139 mEq/L (ref 135–145)
TOTAL PROTEIN: 6.7 g/dL (ref 6.0–8.3)

## 2015-05-21 ENCOUNTER — Ambulatory Visit: Payer: BLUE CROSS/BLUE SHIELD | Admitting: Family Medicine

## 2015-06-13 ENCOUNTER — Other Ambulatory Visit: Payer: Self-pay | Admitting: Family Medicine

## 2015-08-07 ENCOUNTER — Other Ambulatory Visit: Payer: Self-pay | Admitting: Family Medicine

## 2015-08-09 NOTE — Telephone Encounter (Signed)
Left voicemail requesting pt to call office back 

## 2015-08-09 NOTE — Telephone Encounter (Signed)
Please schedule f/u for winter and refill until then  

## 2015-08-09 NOTE — Telephone Encounter (Signed)
Electronic refill request, pt has had recent labs but cancelled her f/u appt after labs and hasn't been seen recently and has no future appt., please advise

## 2015-08-09 NOTE — Telephone Encounter (Signed)
appt scheduled and med refilled 

## 2015-11-07 ENCOUNTER — Other Ambulatory Visit: Payer: Self-pay | Admitting: Family Medicine

## 2015-11-08 ENCOUNTER — Telehealth: Payer: Self-pay | Admitting: Family Medicine

## 2015-11-08 ENCOUNTER — Ambulatory Visit (INDEPENDENT_AMBULATORY_CARE_PROVIDER_SITE_OTHER): Payer: BLUE CROSS/BLUE SHIELD | Admitting: Family Medicine

## 2015-11-08 ENCOUNTER — Encounter: Payer: Self-pay | Admitting: Family Medicine

## 2015-11-08 VITALS — BP 118/80 | HR 75 | Temp 97.7°F | Ht 66.0 in | Wt 333.2 lb

## 2015-11-08 DIAGNOSIS — E039 Hypothyroidism, unspecified: Secondary | ICD-10-CM

## 2015-11-08 DIAGNOSIS — Z23 Encounter for immunization: Secondary | ICD-10-CM | POA: Diagnosis not present

## 2015-11-08 DIAGNOSIS — E785 Hyperlipidemia, unspecified: Secondary | ICD-10-CM

## 2015-11-08 LAB — LIPID PANEL
CHOL/HDL RATIO: 4
CHOLESTEROL: 199 mg/dL (ref 0–200)
HDL: 49.1 mg/dL (ref >39.00)
LDL Cholesterol: 113 mg/dL — ABNORMAL HIGH (ref 0–99)
NonHDL: 149.97
Triglycerides: 183 mg/dL — ABNORMAL HIGH (ref 0.0–149.0)
VLDL: 36.6 mg/dL (ref 0.0–40.0)

## 2015-11-08 LAB — TSH: TSH: 5.94 u[IU]/mL — ABNORMAL HIGH (ref 0.35–4.50)

## 2015-11-08 MED ORDER — LEVOTHYROXINE SODIUM 175 MCG PO TABS: 175.0000 ug | ORAL_TABLET | Freq: Every day | ORAL | Status: DC

## 2015-11-08 MED ORDER — FLUTICASONE-SALMETEROL 100-50 MCG/DOSE IN AEPB: INHALATION_SPRAY | RESPIRATORY_TRACT | Status: DC

## 2015-11-08 NOTE — Progress Notes (Signed)
Pre visit review using our clinic review tool, if applicable. No additional management support is needed unless otherwise documented below in the visit note. 

## 2015-11-08 NOTE — Telephone Encounter (Signed)
Please let pt know that cholesterol is up (Avoid red meat/ fried foods/ egg yolks/ fatty breakfast meats/ butter, cheese and high fat dairy/ and shellfish)  Also tsh is up meaning we need to inc her thyroid dose  She takes levothyroxine 150 mcg - I am inc to 175 mcg Please call it in (and make sure she is ok with the generic form) Re check tsh in 6 wk please

## 2015-11-08 NOTE — Progress Notes (Signed)
Subjective:    Patient ID: Nancy Dunlap, female    DOB: Jun 20, 1976, 39 y.o.   MRN: [id number]  HPI Here for f/u of chronic health problems   Has been feeling ok   Rough year- has been to 2 fertility specialists  Tried some things - incl hormones and IVF (lupron and femara) Will work towards fostering and adopting later  Finally came to terms with it   Lots of ups and downs with hormones - really wrecked her moods Start getting back to normal   Now on Lybrel- and has a lot of breakthrough bleeding   Wt is up 23 lb with bmi of 53 In morbidly obese category  Hypothyroid Is tired all the time Since her female hormones are up and down- wonders how this is  Also work has been busy- ? Multifactorial (working 12-14 hour days) Hair feels a little dry-no big changes   Had a flu shot today   Due for cholesterol check Eating a bit better - working with some delivered meals/ meal plan - more healthy ([employer] and [employer])  Patient Active Problem List   Diagnosis Date Noted  . S/P laparotomy 11/17/2014  . Acute bronchitis 10/28/2014  . Amenorrhea 03/17/2011  . Routine general medical examination at a health care facility 03/16/2011  . Hyperlipidemia 01/03/2010  . Hypothyroidism 10/07/2007  . OBESITY, MORBID 09/30/2007  . LEG EDEMA 09/30/2007   Past Medical History  Diagnosis Date  . Other and unspecified hyperlipidemia     no meds  . Unspecified hypothyroidism   . Edema   . Morbid obesity (HCC)   . Bronchitis     just finished abx  11/01/14, has inhaler for bronchitis tx   Past Surgical History  Procedure Laterality Date  . 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 Wisdom tooth extraction    . Laparotomy N/A 11/17/2014    Procedure: EXPLORATORY LAPAROTOMY, chromopertubation;  Surgeon: Turner Daniels, MD;  Location: [facility];  Service: Gynecology;  Laterality: N/A;  . Lysis of adhesion N/A 11/17/2014    Procedure: LYSIS OF ADHESION;  Surgeon: Turner Daniels, MD;   Location: [facility];  Service: Gynecology;  Laterality: N/A;  . Ovarian cyst removal Right 11/17/2014    Procedure: OVARIAN CYSTECTOMY;  Surgeon: Turner Daniels, MD;  Location: [facility];  Service: Gynecology;  Laterality: Right;   Social History  Substance Use Topics  . Smoking status: Former Smoker -- 0.25 packs/day for 4 years    Types: Cigarettes    Quit date: 12/18/1998  . Smokeless tobacco: Never Used     Comment: Quit in college and was not an everyday smoker  . Alcohol Use: 0.0 oz/week    0 Standard drinks or equivalent per week     Comment: socially   Family History  Problem Relation Age of Onset  . Hypertension Maternal Grandmother   . Cancer Maternal Grandfather   . COPD Maternal Grandfather   . Stroke Paternal Grandfather   . COPD Paternal Grandfather    Allergies  Allergen Reactions  . Erythromycin Nausea And Vomiting    REACTION: n \\T \ v  . Penicillins     REACTION: mom has rxn Patient has never taken PCN Her mom can take cephalosporins without problems  . Soy Allergy Hives   Current Outpatient Prescriptions on File Prior to Visit  Medication Sig Dispense Refill  . ADVAIR DISKUS 100-50 MCG/DOSE AEPB INHALE 1 PUFF TWO  TIMES A DAY AS NEEDED 60 each 4  . ibuprofen (ADVIL,MOTRIN) 600 MG tablet Take 1 tablet (600 mg total) by mouth every 6 (six) hours as needed (mild pain). 30 tablet 0  . methocarbamol (ROBAXIN) 500 MG tablet Take 1 tablet (500 mg total) by mouth every 6 (six) hours as needed for muscle spasms. 30 tablet 0  . SYNTHROID 150 MCG tablet TAKE 1 TABLET BY MOUTH DAILY BEFORE BREAKFAST. 30 tablet 2   No current facility-administered medications on file prior to visit.     Review of Systems    Review of Systems  Constitutional: Negative for fever, appetite change,  and unexpected weight change. pos for fatigue  Eyes: Negative for pain and visual disturbance.  Respiratory: Negative for cough and shortness of breath.   Cardiovascular: Negative for cp or  palpitations    Gastrointestinal: Negative for nausea, diarrhea and constipation.  Genitourinary: Negative for urgency and frequency. pos for irregular menstrual bleeding  Skin: Negative for pallor or rash   Neurological: Negative for weakness, light-headedness, numbness and headaches.  Hematological: Negative for adenopathy. Does not bruise/bleed easily.  Psychiatric/Behavioral: Negative for dysphoric mood. The patient is not nervous/anxious.      Objective:   Physical Exam  Constitutional: She appears well-developed and well-nourished. No distress.  Morbidly obese and well appearing   HENT:  Head: Normocephalic and atraumatic.  Mouth/Throat: Oropharynx is clear and moist.  Eyes: Conjunctivae and EOM are normal. Pupils are equal, round, and reactive to light.  Neck: Normal range of motion. Neck supple. No JVD present. Carotid bruit is not present. No thyromegaly present.  No goiter   Cardiovascular: Normal rate, regular rhythm, normal heart sounds and intact distal pulses.  Exam reveals no gallop.   Pulmonary/Chest: Effort normal and breath sounds normal. No respiratory distress. She has no wheezes. She has no rales.  No crackles  Abdominal: Soft. Bowel sounds are normal. She exhibits no distension, no abdominal bruit and no mass. There is no tenderness.  Musculoskeletal: She exhibits no edema.  Lymphadenopathy:    She has no cervical adenopathy.  Neurological: She is alert. She has normal reflexes.  Skin: Skin is warm and dry. No rash noted.  Psychiatric: She has a normal mood and affect.  Nl affect- but pt becomes slt tearful when she disc her fertility problems           Assessment & Plan:   Problem List Items Addressed This Visit      Endocrine   Hypothyroidism - Primary    Pt is fatigued Disc poss that all the hormonal fertility treatments could also cause thyroid changes  TSH today and commend / change dose if necessary       Relevant Orders   TSH (Completed)      Other   Hyperlipidemia   Relevant Orders   Lipid panel (Completed)   OBESITY, MORBID    Discussed how this problem influences overall health and the risks it imposes  Reviewed plan for weight loss with lower calorie diet (via better food choices and also portion control or program like weight watchers) and exercise building up to or more than 30 minutes 5 days per week including some aerobic activity   Having difficulty with long work days- fitting in exercise  Is trying to eat better with some home delivered meal prep  Wt is up - unsure if hormonal fertility treatments could have played a role        Other Visit Diagnoses  Need for influenza vaccination        Relevant Orders    Flu Vaccine QUAD 36+ mos PF IM (Fluarix & Fluzone Quad PF) (Completed)

## 2015-11-08 NOTE — Patient Instructions (Addendum)
Flu shot today  Labs today Take care of yourself - with healthy diet and exercise when you can fit it in   For cholesterol  Avoid red meat/ fried foods/ egg yolks/ fatty breakfast meats/ butter, cheese and high fat dairy/ and shellfish

## 2015-11-08 NOTE — Assessment & Plan Note (Signed)
Discussed how this problem influences overall health and the risks it imposes  Reviewed plan for weight loss with lower calorie diet (via better food choices and also portion control or program like weight watchers) and exercise building up to or more than 30 minutes 5 days per week including some aerobic activity   Having difficulty with long work days- fitting in exercise  Is trying to eat better with some home delivered meal prep  Wt is up - unsure if hormonal fertility treatments could have played a role

## 2015-11-08 NOTE — Assessment & Plan Note (Signed)
Pt is fatigued Disc poss that all the hormonal fertility treatments could also cause thyroid changes  TSH today and commend / change dose if necessary

## 2015-11-09 MED ORDER — LEVOTHYROXINE SODIUM 175 MCG PO TABS: 175.0000 ug | ORAL_TABLET | Freq: Every day | ORAL | Status: DC

## 2015-11-09 NOTE — Telephone Encounter (Signed)
Pt notified of lab results and Dr. Royden Purlower's comments. Rx sent to pharmacy and pt will call back once she has her calender to schedule her lab appointment

## 2016-05-05 DIAGNOSIS — M25512 Pain in left shoulder: Secondary | ICD-10-CM | POA: Diagnosis not present

## 2016-05-05 DIAGNOSIS — S43102A Unspecified dislocation of left acromioclavicular joint, initial encounter: Secondary | ICD-10-CM | POA: Diagnosis not present

## 2016-05-05 DIAGNOSIS — M19012 Primary osteoarthritis, left shoulder: Secondary | ICD-10-CM | POA: Diagnosis not present

## 2016-06-27 DIAGNOSIS — S4992XD Unspecified injury of left shoulder and upper arm, subsequent encounter: Secondary | ICD-10-CM | POA: Diagnosis not present

## 2016-06-27 DIAGNOSIS — S39012A Strain of muscle, fascia and tendon of lower back, initial encounter: Secondary | ICD-10-CM | POA: Diagnosis not present

## 2016-08-07 DIAGNOSIS — N809 Endometriosis, unspecified: Secondary | ICD-10-CM | POA: Diagnosis not present

## 2016-08-07 DIAGNOSIS — N979 Female infertility, unspecified: Secondary | ICD-10-CM | POA: Diagnosis not present

## 2016-08-07 DIAGNOSIS — R102 Pelvic and perineal pain: Secondary | ICD-10-CM | POA: Diagnosis not present

## 2016-08-07 DIAGNOSIS — Z6841 Body Mass Index (BMI) 40.0 and over, adult: Secondary | ICD-10-CM | POA: Diagnosis not present

## 2016-08-19 DIAGNOSIS — N809 Endometriosis, unspecified: Secondary | ICD-10-CM | POA: Diagnosis not present

## 2016-08-19 DIAGNOSIS — N836 Hematosalpinx: Secondary | ICD-10-CM | POA: Diagnosis not present

## 2016-10-06 DIAGNOSIS — R1031 Right lower quadrant pain: Secondary | ICD-10-CM | POA: Diagnosis not present

## 2016-10-06 DIAGNOSIS — E039 Hypothyroidism, unspecified: Secondary | ICD-10-CM | POA: Diagnosis not present

## 2016-10-06 DIAGNOSIS — K760 Fatty (change of) liver, not elsewhere classified: Secondary | ICD-10-CM | POA: Diagnosis not present

## 2016-10-06 DIAGNOSIS — R160 Hepatomegaly, not elsewhere classified: Secondary | ICD-10-CM | POA: Diagnosis not present

## 2016-10-06 DIAGNOSIS — E669 Obesity, unspecified: Secondary | ICD-10-CM | POA: Diagnosis not present

## 2016-10-06 DIAGNOSIS — Z87891 Personal history of nicotine dependence: Secondary | ICD-10-CM | POA: Diagnosis not present

## 2016-10-06 DIAGNOSIS — J45909 Unspecified asthma, uncomplicated: Secondary | ICD-10-CM | POA: Diagnosis not present

## 2016-10-06 DIAGNOSIS — R300 Dysuria: Secondary | ICD-10-CM | POA: Diagnosis not present

## 2016-10-07 DIAGNOSIS — R1031 Right lower quadrant pain: Secondary | ICD-10-CM | POA: Diagnosis not present

## 2016-10-07 DIAGNOSIS — R160 Hepatomegaly, not elsewhere classified: Secondary | ICD-10-CM | POA: Diagnosis not present

## 2016-10-07 DIAGNOSIS — K760 Fatty (change of) liver, not elsewhere classified: Secondary | ICD-10-CM | POA: Diagnosis not present

## 2016-10-29 ENCOUNTER — Other Ambulatory Visit: Payer: Self-pay | Admitting: Family Medicine

## 2016-10-30 NOTE — Telephone Encounter (Signed)
No recent/future appts., and no recent labs, please advise

## 2016-10-30 NOTE — Telephone Encounter (Signed)
Please schedule a 30 min f/u and refill until then 

## 2016-10-31 NOTE — Telephone Encounter (Signed)
Patient returned Shapale's call.  Patient scheduled 30 minute follow up appointment on 11/15/16.  Please refill medication.

## 2016-10-31 NOTE — Telephone Encounter (Signed)
Left voicemail requesting pt to call the office back 

## 2016-11-01 NOTE — Telephone Encounter (Signed)
Med refilled.

## 2016-11-15 ENCOUNTER — Other Ambulatory Visit: Payer: Self-pay | Admitting: Family Medicine

## 2016-11-15 ENCOUNTER — Encounter: Payer: Self-pay | Admitting: Family Medicine

## 2016-11-15 ENCOUNTER — Ambulatory Visit (INDEPENDENT_AMBULATORY_CARE_PROVIDER_SITE_OTHER): Payer: BLUE CROSS/BLUE SHIELD | Admitting: Family Medicine

## 2016-11-15 VITALS — BP 128/78 | HR 95 | Temp 97.8°F | Wt 329.0 lb

## 2016-11-15 DIAGNOSIS — E7800 Pure hypercholesterolemia, unspecified: Secondary | ICD-10-CM | POA: Diagnosis not present

## 2016-11-15 DIAGNOSIS — Z23 Encounter for immunization: Secondary | ICD-10-CM

## 2016-11-15 DIAGNOSIS — E039 Hypothyroidism, unspecified: Secondary | ICD-10-CM | POA: Diagnosis not present

## 2016-11-15 NOTE — Progress Notes (Signed)
Subjective:    Patient ID: Nancy LemonsJennifer R Dunlap, female    DOB: 07/07/1976, 40 y.o.   MRN: [id number]  HPI Here for f/u of chronic medical problems  Feeling good overall   Working  [employer]- is about the same/ [facility] and had disc expl surgery or disc IVF She has a lot of scarring in her system  Feels like she just needs to move on  Is thinking about adopting   Also a flu shot   Wt Readings from Last 3 Encounters:  11/15/16 (!) 329 lb (149.2 kg)  11/08/15 (!) 333 lb 4 oz (151.2 kg)  12/02/14 (!) 310 lb 8 oz (140.8 kg)  stable overall  Hard to loose with the hormonal changes - on progesterone / feels like she has pms all the time  Then had hot flashes from lupron  bmi is 53.1  BP Readings from Last 3 Encounters:  11/15/16 128/78  11/08/15 118/80  12/02/14 108/64    Hypothyroid Due for labs  On levothyroxine 175 mcg Due for check Feels tired and also hot in general  On hormones also   Hx of hyperlipidemia Lab Results  Component Value Date   CHOL 199 11/08/2015   CHOL 169 05/18/2015   CHOL 208 (H) 04/10/2012   Lab Results  Component Value Date   HDL 49.10 11/08/2015   HDL 47.30 05/18/2015   HDL 63.40 04/10/2012   Lab Results  Component Value Date   LDLCALC 113 (H) 11/08/2015   LDLCALC 101 (H) 05/18/2015   LDLCALC 113 (H) 03/17/2011   Lab Results  Component Value Date   TRIG 183.0 (H) 11/08/2015   TRIG 102.0 05/18/2015   TRIG 127.0 04/10/2012   Lab Results  Component Value Date   CHOLHDL 4 11/08/2015   CHOLHDL 4 05/18/2015   CHOLHDL 3 04/10/2012   Lab Results  Component Value Date   LDLDIRECT 126.4 04/10/2012   LDLDIRECT 135.3 10/28/2010   LDLDIRECT 139.3 09/30/2007    Eating fair/about the same   Patient Active Problem List   Diagnosis Date Noted  . S/P laparotomy 11/17/2014  . Amenorrhea 03/17/2011  . Routine general medical examination at a health care facility 03/16/2011  . Hyperlipidemia 01/03/2010  . Hypothyroidism 10/07/2007  .  OBESITY, MORBID 09/30/2007  . LEG EDEMA 09/30/2007   Past Medical History:  Diagnosis Date  . Bronchitis    just finished abx  11/01/14, has inhaler for bronchitis tx  . Edema   . Morbid obesity (HCC)   . Other and unspecified hyperlipidemia    no meds  . Unspecified hypothyroidism    Past Surgical History:  Procedure Laterality Date  . EYE SURGERY     lazy eye as a child  . LAPAROTOMY N/A 11/17/2014   Procedure: EXPLORATORY LAPAROTOMY, chromopertubation;  Surgeon: Turner Danielsavid C Lowe, MD;  Location: [facility];  Service: Gynecology;  Laterality: N/A;  . LYSIS OF ADHESION N/A 11/17/2014   Procedure: LYSIS OF ADHESION;  Surgeon: Turner Danielsavid C Lowe, MD;  Location: [facility];  Service: Gynecology;  Laterality: N/A;  . OVARIAN CYST REMOVAL Right 11/17/2014   Procedure: OVARIAN CYSTECTOMY;  Surgeon: Turner Danielsavid C Lowe, MD;  Location: [facility];  Service: Gynecology;  Laterality: Right;  . WISDOM TOOTH EXTRACTION     Social History  Substance Use Topics  . Smoking status: Former Smoker    Packs/day: 0.25    Years: 4.00    Types: Cigarettes    Quit date: 12/18/1998  . Smokeless tobacco: Never  Used     Comment: Quit in college and was not an everyday smoker  . Alcohol use 0.0 oz/week     Comment: socially   Family History  Problem Relation Age of Onset  . Hypertension Maternal Grandmother   . Cancer Maternal Grandfather   . COPD Maternal Grandfather   . Stroke Paternal Grandfather   . COPD Paternal Grandfather    Allergies  Allergen Reactions  . Erythromycin Nausea And Vomiting    REACTION: n \\T \ v  . Penicillins     REACTION: mom has rxn Patient has never taken PCN Her mom can take cephalosporins without problems  . Soy Allergy Hives   Current Outpatient Prescriptions on File Prior to Visit  Medication Sig Dispense Refill  . ibuprofen (ADVIL,MOTRIN) 600 MG tablet Take 1 tablet (600 mg total) by mouth every 6 (six) hours as needed (mild pain). 30 tablet 0  . methocarbamol (ROBAXIN) 500 MG tablet Take  1 tablet (500 mg total) by mouth every 6 (six) hours as needed for muscle spasms. 30 tablet 0  . SYNTHROID 175 MCG tablet TAKE 1 TABLET BY MOUTH EVERY DAY BEFORE BREAKFAST 30 tablet 11   No current facility-administered medications on file prior to visit.     Review of Systems    Review of Systems  Constitutional: Negative for fever, appetite change,  and unexpected weight change.  Eyes: Negative for pain and visual disturbance.  Respiratory: Negative for cough and shortness of breath.   Cardiovascular: Negative for cp or palpitations    Gastrointestinal: Negative for nausea, diarrhea and constipation.  Genitourinary: Negative for urgency and frequency. pos for irreg menses and hormone side effects  Skin: Negative for pallor or rash   Neurological: Negative for weakness, light-headedness, numbness and headaches.  Hematological: Negative for adenopathy. Does not bruise/bleed easily.  Psychiatric/Behavioral: Negative for dysphoric mood. The patient is not nervous/anxious.      Objective:   Physical Exam  Constitutional: She appears well-developed and well-nourished. No distress.  obese and well appearing   HENT:  Head: Normocephalic and atraumatic.  Mouth/Throat: Oropharynx is clear and moist.  Eyes: Conjunctivae and EOM are normal. Pupils are equal, round, and reactive to light.  Neck: Normal range of motion. Neck supple. No JVD present. Carotid bruit is not present. No thyromegaly present.  No goiter  Cardiovascular: Normal rate, regular rhythm, normal heart sounds and intact distal pulses.  Exam reveals no gallop.   Pulmonary/Chest: Effort normal and breath sounds normal. No respiratory distress. She has no wheezes. She has no rales.  No crackles  Abdominal: She exhibits no abdominal bruit.  Musculoskeletal: She exhibits no edema.  Lymphadenopathy:    She has no cervical adenopathy.  Neurological: She is alert. She has normal reflexes. She displays no tremor.  Skin: Skin is  warm and dry. No rash noted. No pallor.  Psychiatric: She has a normal mood and affect.          Assessment & Plan:   Problem List Items Addressed This Visit      Endocrine   Hypothyroidism - Primary    Lab today  Some fatigue/no other clinical change She has been on various hormones for fertility that may also impact thyroid  Pend tsh      Relevant Orders   TSH (Completed)     Other   OBESITY, MORBID    Discussed how this problem influences overall health and the risks it imposes  Reviewed plan for weight loss with lower  calorie diet (via better food choices and also portion control or program like weight watchers) and exercise building up to or more than 30 minutes 5 days per week including some aerobic activity   Enc her to start an exercise program as tolerated       Hyperlipidemia    Disc goals for lipids and reasons to control them Rev labs with pt-from last check  Rev low sat fat diet in detail         Other Visit Diagnoses    Need for influenza vaccination       Relevant Orders   Flu Vaccine QUAD 36+ mos IM (Completed)

## 2016-11-15 NOTE — Patient Instructions (Signed)
Thyroid check today  Flu shot today  Take care of yourself  Try to exercise when you can for general health and stress in addition to fitness

## 2016-11-16 ENCOUNTER — Encounter: Payer: Self-pay | Admitting: *Deleted

## 2016-11-16 LAB — TSH: TSH: 0.37 u[IU]/mL (ref 0.35–4.50)

## 2016-11-16 MED ORDER — SYNTHROID 175 MCG PO TABS: ORAL_TABLET | ORAL | 11 refills | Status: DC

## 2016-11-16 NOTE — Addendum Note (Signed)
Addended by: Shon MilletWATLINGTON, Clayton Bosserman M on: 11/16/2016 12:56 PM   Modules accepted: Orders

## 2016-11-16 NOTE — Assessment & Plan Note (Signed)
Discussed how this problem influences overall health and the risks it imposes  Reviewed plan for weight loss with lower calorie diet (via better food choices and also portion control or program like weight watchers) and exercise building up to or more than 30 minutes 5 days per week including some aerobic activity   Enc her to start an exercise program as tolerated

## 2016-11-16 NOTE — Assessment & Plan Note (Signed)
Disc goals for lipids and reasons to control them Rev labs with pt- from last check  Rev low sat fat diet in detail  

## 2016-11-16 NOTE — Assessment & Plan Note (Signed)
Lab today  Some fatigue/no other clinical change She has been on various hormones for fertility that may also impact thyroid  Pend tsh

## 2016-12-01 ENCOUNTER — Other Ambulatory Visit: Payer: Self-pay | Admitting: Family Medicine

## 2016-12-05 ENCOUNTER — Telehealth: Payer: BLUE CROSS/BLUE SHIELD | Admitting: Family

## 2016-12-05 DIAGNOSIS — J209 Acute bronchitis, unspecified: Secondary | ICD-10-CM

## 2016-12-05 DIAGNOSIS — R05 Cough: Secondary | ICD-10-CM

## 2016-12-05 DIAGNOSIS — R059 Cough, unspecified: Secondary | ICD-10-CM

## 2016-12-05 MED ORDER — DOXYCYCLINE HYCLATE 100 MG PO TABS: 100.0000 mg | ORAL_TABLET | Freq: Two times a day (BID) | ORAL | 0 refills | Status: DC

## 2016-12-05 MED ORDER — PREDNISONE 10 MG (21) PO TBPK: 10.0000 mg | ORAL_TABLET | Freq: Every day | ORAL | 0 refills | Status: DC

## 2016-12-05 NOTE — Progress Notes (Signed)
We are sorry that you are not feeling well.  Here is how we plan to help!  Based on what you have shared with me it looks like you have upper respiratory tract inflammation that has resulted in a significant cough.  Inflammation and infection in the upper respiratory tract is commonly called bronchitis and has four common causes:  Allergies, Viral Infections, Acid Reflux and Bacterial Infections.  Allergies, viruses and acid reflux are treated by controlling symptoms or eliminating the cause. An example might be a cough caused by taking certain blood pressure medications. You stop the cough by changing the medication. Another example might be a cough caused by acid reflux. Controlling the reflux helps control the cough.  Based on your presentation I believe you most likely have A cough due to bacteria.  When patients have a fever and a productive cough with a change in color or increased sputum production, we are concerned about bacterial bronchitis.  If left untreated it can progress to pneumonia.  If your symptoms do not improve with your treatment plan it is important that you contact your provider.   I hve prescribed Doxycycline 100 mg twice a day for 7 days     In addition you may use A non-prescription cough medication called Robitussin DAC. Take 2 teaspoons every 8 hours or Delsym: take 2 teaspoons every 12 hours.  Sterapred 10 mg dosepak  USE OF BRONCHODILATOR ("RESCUE") INHALERS: There is a risk from using your bronchodilator too frequently.  The risk is that over-reliance on a medication which only relaxes the muscles surrounding the breathing tubes can reduce the effectiveness of medications prescribed to reduce swelling and congestion of the tubes themselves.  Although you feel brief relief from the bronchodilator inhaler, your asthma may actually be worsening with the tubes becoming more swollen and filled with mucus.  This can delay other crucial treatments, such as oral steroid medications.  If you need to use a bronchodilator inhaler daily, several times per day, you should discuss this with your provider.  There are probably better treatments that could be used to keep your asthma under control.     HOME CARE . Only take medications as instructed by your medical team. . Complete the entire course of an antibiotic. . Drink plenty of fluids and get plenty of rest. . Avoid close contacts especially the very young and the elderly . Cover your mouth if you cough or cough into your sleeve. . Always remember to wash your hands . A steam or ultrasonic humidifier can help congestion.   GET HELP RIGHT AWAY IF: . You develop worsening fever. . You become short of breath . You cough up blood. . Your symptoms persist after you have completed your treatment plan MAKE SURE YOU   Understand these instructions.  Will watch your condition.  Will get help right away if you are not doing well or get worse.  Your e-visit answers were reviewed by a board certified advanced clinical practitioner to complete your personal care plan.  Depending on the condition, your plan could have included both over the counter or prescription medications. If there is a problem please reply  once you have received a response from your provider. Your safety is important to us.  If you have drug allergies check your prescription carefully.    You can use MyChart to ask questions about today's visit, request a non-urgent call back, or ask for a work or school excuse for 24 hours related to  this e-Visit. If it has been greater than 24 hours you will need to follow up with your provider, or enter a new e-Visit to address those concerns. You will get an e-mail in the next two days asking about your experience.  I hope that your e-visit has been valuable and will speed your recovery. Thank you for using e-visits.

## 2017-04-10 DIAGNOSIS — Z6841 Body Mass Index (BMI) 40.0 and over, adult: Secondary | ICD-10-CM | POA: Diagnosis not present

## 2017-04-10 DIAGNOSIS — N979 Female infertility, unspecified: Secondary | ICD-10-CM | POA: Diagnosis not present

## 2017-04-10 DIAGNOSIS — N809 Endometriosis, unspecified: Secondary | ICD-10-CM | POA: Diagnosis not present

## 2017-04-10 DIAGNOSIS — R102 Pelvic and perineal pain: Secondary | ICD-10-CM | POA: Diagnosis not present

## 2017-11-13 ENCOUNTER — Other Ambulatory Visit: Payer: Self-pay | Admitting: Family Medicine

## 2017-11-14 NOTE — Telephone Encounter (Signed)
Please schedule a winter f/u and refill until then 

## 2017-11-14 NOTE — Telephone Encounter (Signed)
appt scheduled and med refilled 

## 2017-11-14 NOTE — Telephone Encounter (Signed)
Pt hasn't been seen in a year and no future appt., please advise

## 2017-11-17 ENCOUNTER — Other Ambulatory Visit: Payer: Self-pay | Admitting: Family Medicine

## 2017-12-11 ENCOUNTER — Other Ambulatory Visit: Payer: Self-pay | Admitting: Family Medicine

## 2017-12-12 ENCOUNTER — Ambulatory Visit: Payer: BLUE CROSS/BLUE SHIELD | Admitting: Family Medicine

## 2017-12-12 ENCOUNTER — Encounter: Payer: Self-pay | Admitting: Family Medicine

## 2017-12-12 VITALS — BP 134/96 | HR 83 | Temp 98.2°F | Ht 67.0 in | Wt 344.8 lb

## 2017-12-12 DIAGNOSIS — Z23 Encounter for immunization: Secondary | ICD-10-CM | POA: Diagnosis not present

## 2017-12-12 DIAGNOSIS — N809 Endometriosis, unspecified: Secondary | ICD-10-CM | POA: Diagnosis not present

## 2017-12-12 DIAGNOSIS — E7800 Pure hypercholesterolemia, unspecified: Secondary | ICD-10-CM

## 2017-12-12 DIAGNOSIS — R609 Edema, unspecified: Secondary | ICD-10-CM | POA: Diagnosis not present

## 2017-12-12 DIAGNOSIS — J9801 Acute bronchospasm: Secondary | ICD-10-CM | POA: Diagnosis not present

## 2017-12-12 DIAGNOSIS — E039 Hypothyroidism, unspecified: Secondary | ICD-10-CM | POA: Diagnosis not present

## 2017-12-12 LAB — COMPREHENSIVE METABOLIC PANEL
ALT: 32 U/L (ref 0–35)
AST: 18 U/L (ref 0–37)
Albumin: 4 g/dL (ref 3.5–5.2)
Alkaline Phosphatase: 39 U/L (ref 39–117)
BILIRUBIN TOTAL: 0.5 mg/dL (ref 0.2–1.2)
BUN: 12 mg/dL (ref 6–23)
CALCIUM: 9 mg/dL (ref 8.4–10.5)
CHLORIDE: 105 meq/L (ref 96–112)
CO2: 26 meq/L (ref 19–32)
Creatinine, Ser: 1.02 mg/dL (ref 0.40–1.20)
GFR: 63.75 mL/min (ref >60.00)
GLUCOSE: 87 mg/dL (ref 70–99)
POTASSIUM: 4.3 meq/L (ref 3.5–5.1)
Sodium: 138 mEq/L (ref 135–145)
Total Protein: 7 g/dL (ref 6.0–8.3)

## 2017-12-12 LAB — LIPID PANEL
CHOL/HDL RATIO: 6
Cholesterol: 197 mg/dL (ref 0–200)
HDL: 31 mg/dL — AB (ref >39.00)
LDL CALC: 132 mg/dL — AB (ref 0–99)
NONHDL: 165.62
TRIGLYCERIDES: 169 mg/dL — AB (ref 0.0–149.0)
VLDL: 33.8 mg/dL (ref 0.0–40.0)

## 2017-12-12 LAB — TSH: TSH: 0.61 u[IU]/mL (ref 0.35–4.50)

## 2017-12-12 MED ORDER — FLUTICASONE-SALMETEROL 100-50 MCG/DOSE IN AEPB: INHALATION_SPRAY | RESPIRATORY_TRACT | 11 refills | Status: DC

## 2017-12-12 NOTE — Assessment & Plan Note (Signed)
Continues gyn f/u for tx with norethindrone  Not a surg candidate  Has infertility   Does not get pap tests at this time

## 2017-12-12 NOTE — Assessment & Plan Note (Signed)
Comes and goes  Worse at end of the day  May try support hose otc  Enc to watch sodium intake

## 2017-12-12 NOTE — Assessment & Plan Note (Signed)
Discussed how this problem influences overall health and the risks it imposes  Reviewed plan for weight loss with lower calorie diet (via better food choices and also portion control or program like weight watchers) and exercise building up to or more than 30 minutes 5 days per week including some aerobic activity   Pt has difficulty with weight due to her hormone status  Enc her to get carbs from produce /avoid processed and convenience foods  More exercise

## 2017-12-12 NOTE — Assessment & Plan Note (Signed)
Occasional No hx of asthma- but has had bronchitis with bronchospasm freq in the past  Refilled advair which is helpful

## 2017-12-12 NOTE — Assessment & Plan Note (Signed)
Due for labs Disc goals for lipids and reasons to control them Rev labs with pt  (last check) Rev low sat fat diet in detail

## 2017-12-12 NOTE — Assessment & Plan Note (Signed)
Hypothyroidism  Pt has no clinical changes No change in energy level/ hair or skin/ edema and no tremor  Labs today for TSH

## 2017-12-12 NOTE — Patient Instructions (Signed)
Labs today  Try to take care of yourself - diet and exercise   Think about support hose (over the counter) for swelling

## 2017-12-12 NOTE — Progress Notes (Signed)
Subjective:    Patient ID: Nancy Dunlap, female    DOB: 04/08/1976, 41 y.o.   MRN: [id number]  HPI Here for f/u of chronic health problems   Has been doing well and feeling well   Last gyn visit was march - did not do a pap  Now she is on the norehtindrone  No menses on this  occ hot flashes /occ spotting  Endometriosis  Unable to do hysterectomy due to risks with location (around colon)   Infertile  Is still thinking about adopting     Wt Readings from Last 3 Encounters:  12/12/17 (!) 344 lb 12 oz (156.4 kg)  11/15/16 (!) 329 lb (149.2 kg)  11/08/15 (!) 333 lb 4 oz (151.2 kg)  doing the same with diet and exercise   (she thinks her hormones cause weight gain more than anything   54.00 kg/m   Getting her flu shot today   Hypothyroidism  Pt has no clinical changes-feels the same  No change in energy level/ hair or skin/ edema and no tremor Lab Results  Component Value Date   TSH 0.37 11/15/2016     Due for TSH  Hyperlipidemia Lab Results  Component Value Date   CHOL 199 11/08/2015   HDL 49.10 11/08/2015   LDLCALC 113 (H) 11/08/2015   LDLDIRECT 126.4 04/10/2012   TRIG 183.0 (H) 11/08/2015   CHOLHDL 4 11/08/2015  diet-no change  Working a lot- eats healthy for the most part but less so when busy  A lot of deadline   Patient Active Problem List   Diagnosis Date Noted  . Endometriosis 12/12/2017  . Bronchospasm 12/12/2017  . S/P laparotomy 11/17/2014  . Amenorrhea 03/17/2011  . Routine general medical examination at a health care facility 03/16/2011  . Hyperlipidemia 01/03/2010  . Hypothyroidism 10/07/2007  . OBESITY, MORBID 09/30/2007  . LEG EDEMA 09/30/2007   Past Medical History:  Diagnosis Date  . Bronchitis    just finished abx  11/01/14, has inhaler for bronchitis tx  . Edema   . Morbid obesity (HCC)   . Other and unspecified hyperlipidemia    no meds  . Unspecified hypothyroidism    Past Surgical History:  Procedure Laterality Date   . EYE SURGERY     lazy eye as a child  . LAPAROTOMY N/A 11/17/2014   Procedure: EXPLORATORY LAPAROTOMY, chromopertubation;  Surgeon: Turner Danielsavid C Lowe, MD;  Location: [facility];  Service: Gynecology;  Laterality: N/A;  . LYSIS OF ADHESION N/A 11/17/2014   Procedure: LYSIS OF ADHESION;  Surgeon: Turner Danielsavid C Lowe, MD;  Location: [facility];  Service: Gynecology;  Laterality: N/A;  . OVARIAN CYST REMOVAL Right 11/17/2014   Procedure: OVARIAN CYSTECTOMY;  Surgeon: Turner Danielsavid C Lowe, MD;  Location: [facility];  Service: Gynecology;  Laterality: Right;  . WISDOM TOOTH EXTRACTION     Social History   Tobacco Use  . Smoking status: Former Smoker    Packs/day: 0.25    Years: 4.00    Pack years: 1.00    Types: Cigarettes    Last attempt to quit: 12/18/1998    Years since quitting: 18.9  . Smokeless tobacco: Never Used  . Tobacco comment: Quit in college and was not an everyday smoker  Substance Use Topics  . Alcohol use: Yes    Alcohol/week: 0.0 oz    Comment: socially  . Drug use: No   Family History  Problem Relation Age of Onset  . Hypertension Maternal Grandmother   . Cancer  Maternal Grandfather   . COPD Maternal Grandfather   . Stroke Paternal Grandfather   . COPD Paternal Grandfather    Allergies  Allergen Reactions  . Erythromycin Nausea And Vomiting    REACTION: n \\T \ v  . Penicillins     REACTION: mom has rxn Patient has never taken PCN Her mom can take cephalosporins without problems  . Soy Allergy Hives   Current Outpatient Medications on File Prior to Visit  Medication Sig Dispense Refill  . ibuprofen (ADVIL,MOTRIN) 600 MG tablet Take 1 tablet (600 mg total) by mouth every 6 (six) hours as needed (mild pain). 30 tablet 0  . norethindrone (AYGESTIN) 5 MG tablet Take 1 tablet by mouth 3 (three) times daily.     Marland Kitchen SYNTHROID 175 MCG tablet TAKE 1 TABLET BY MOUTH EVERY DAY BEFORE BREAKFAST 30 tablet 0   No current facility-administered medications on file prior to visit.      Review of  Systems  Constitutional: Positive for unexpected weight change. Negative for activity change, appetite change, fatigue and fever.  HENT: Negative for congestion, ear pain, rhinorrhea, sinus pressure and sore throat.   Eyes: Negative for pain, redness and visual disturbance.  Respiratory: Negative for cough, shortness of breath and wheezing.   Cardiovascular: Negative for chest pain and palpitations.  Gastrointestinal: Negative for abdominal pain, blood in stool, constipation and diarrhea.  Endocrine: Negative for polydipsia and polyuria.  Genitourinary: Negative for dysuria, frequency and urgency.  Musculoskeletal: Negative for arthralgias, back pain and myalgias.  Skin: Negative for pallor and rash.  Allergic/Immunologic: Negative for environmental allergies.  Neurological: Negative for dizziness, syncope and headaches.  Hematological: Negative for adenopathy. Does not bruise/bleed easily.  Psychiatric/Behavioral: Negative for decreased concentration and dysphoric mood. The patient is not nervous/anxious.        Objective:   Physical Exam  Constitutional: She appears well-developed and well-nourished. No distress.  Morbidly obese and well appearing   HENT:  Head: Normocephalic and atraumatic.  Mouth/Throat: Oropharynx is clear and moist.  Eyes: Conjunctivae and EOM are normal. Pupils are equal, round, and reactive to light.  Neck: Normal range of motion. Neck supple. No JVD present. Carotid bruit is not present. No thyromegaly present.  Cardiovascular: Normal rate, regular rhythm, normal heart sounds and intact distal pulses. Exam reveals no gallop.  Pulmonary/Chest: Effort normal and breath sounds normal. No respiratory distress. She has no wheezes. She has no rales.  No crackles  Good air exch No wheeze  Abdominal: Soft. Bowel sounds are normal. She exhibits no distension, no abdominal bruit and no mass. There is no tenderness.  Musculoskeletal: She exhibits no edema.  No  pitting edema today  No varicosities   Lymphadenopathy:    She has no cervical adenopathy.  Neurological: She is alert. She has normal reflexes. She displays no tremor. No cranial nerve deficit. She exhibits normal muscle tone. Coordination normal.  Skin: Skin is warm and dry. No rash noted. No pallor.  Psychiatric: She has a normal mood and affect.          Assessment & Plan:   Problem List Items Addressed This Visit      Endocrine   Hypothyroidism - Primary    Hypothyroidism  Pt has no clinical changes No change in energy level/ hair or skin/ edema and no tremor  Labs today for TSH      Relevant Orders   TSH     Other   Bronchospasm    Occasional No hx of  asthma- but has had bronchitis with bronchospasm freq in the past  Refilled advair which is helpful      Endometriosis    Continues gyn f/u for tx with norethindrone  Not a surg candidate  Has infertility   Does not get pap tests at this time       Hyperlipidemia    Due for labs Disc goals for lipids and reasons to control them Rev labs with pt  (last check) Rev low sat fat diet in detail       Relevant Orders   Comprehensive metabolic panel   Lipid panel   LEG EDEMA    Comes and goes  Worse at end of the day  May try support hose otc  Enc to watch sodium intake       OBESITY, MORBID    Discussed how this problem influences overall health and the risks it imposes  Reviewed plan for weight loss with lower calorie diet (via better food choices and also portion control or program like weight watchers) and exercise building up to or more than 30 minutes 5 days per week including some aerobic activity   Pt has difficulty with weight due to her hormone status  Enc her to get carbs from produce /avoid processed and convenience foods  More exercise        Other Visit Diagnoses    Need for influenza vaccination       Relevant Orders   Flu Vaccine QUAD 6+ mos PF IM (Fluarix Quad PF) (Completed)

## 2017-12-19 ENCOUNTER — Other Ambulatory Visit: Payer: Self-pay | Admitting: Family Medicine

## 2017-12-24 ENCOUNTER — Encounter: Payer: Self-pay | Admitting: *Deleted

## 2018-06-16 ENCOUNTER — Other Ambulatory Visit: Payer: Self-pay | Admitting: Family Medicine

## 2018-09-14 ENCOUNTER — Other Ambulatory Visit: Payer: Self-pay | Admitting: Family Medicine

## 2018-11-13 ENCOUNTER — Ambulatory Visit: Payer: BLUE CROSS/BLUE SHIELD | Admitting: Family Medicine

## 2018-11-13 ENCOUNTER — Encounter: Payer: Self-pay | Admitting: Family Medicine

## 2018-11-13 VITALS — BP 130/80 | HR 89 | Ht 67.0 in | Wt 342.2 lb

## 2018-11-13 DIAGNOSIS — E7800 Pure hypercholesterolemia, unspecified: Secondary | ICD-10-CM | POA: Diagnosis not present

## 2018-11-13 DIAGNOSIS — Z23 Encounter for immunization: Secondary | ICD-10-CM | POA: Diagnosis not present

## 2018-11-13 DIAGNOSIS — E039 Hypothyroidism, unspecified: Secondary | ICD-10-CM | POA: Diagnosis not present

## 2018-11-13 DIAGNOSIS — J9801 Acute bronchospasm: Secondary | ICD-10-CM

## 2018-11-13 MED ORDER — FLUTICASONE-SALMETEROL 100-50 MCG/DOSE IN AEPB: INHALATION_SPRAY | RESPIRATORY_TRACT | 11 refills | Status: DC

## 2018-11-13 MED ORDER — SYNTHROID 175 MCG PO TABS: ORAL_TABLET | ORAL | 11 refills | Status: DC

## 2018-11-13 NOTE — Progress Notes (Signed)
Subjective:    Patient ID: Nancy Dunlap, female    DOB: 1976/04/30, 42 y.o.   MRN: [id number]  HPI  Here for f/u of chronic health problems  Has been doing well  Taking care of herself  Work has been very busy - a lot of travel   [employer] from Last 3 Encounters:  11/13/18 (!) 342 lb 4 oz (155.2 kg)  12/12/17 (!) 344 lb 12 oz (156.4 kg)  11/15/16 (!) 329 lb (149.2 kg)  diet - eating healthy  Exercise - walking  53.60 kg/m    Hypothyroidism  Pt has no clinical changes No change in energy level/ hair or skin/ edema and no tremor Lab Results  Component Value Date   TSH 0.61 12/12/2017    Due for labs today  Takes levothyroxine 175 mcg daily  She can usually not tell if there is a change   Due for cholesterol check Last Lab Results  Component Value Date   CHOL 197 12/12/2017   HDL 31.00 (L) 12/12/2017   LDLCALC 132 (H) 12/12/2017   LDLDIRECT 126.4 04/10/2012   TRIG 169.0 (H) 12/12/2017   CHOLHDL 6 12/12/2017   Diet controlled  Has been to busy for a re check  Eating healthier- less cholesterol and more healthy fats  Not a lot of beef  occ shellfish  Father's side of the family has high cholesterol Walking for exercise   Given flu vaccine today   Seeing gyn - still taking norethindrone tid  occ spots/no periods  Pain comes and goes (better than it was)   Patient Active Problem List   Diagnosis Date Noted  . Endometriosis 12/12/2017  . Bronchospasm 12/12/2017  . S/P laparotomy 11/17/2014  . Amenorrhea 03/17/2011  . Routine general medical examination at a health care facility 03/16/2011  . Hyperlipidemia 01/03/2010  . Hypothyroidism 10/07/2007  . OBESITY, MORBID 09/30/2007  . LEG EDEMA 09/30/2007   Past Medical History:  Diagnosis Date  . Bronchitis    just finished abx  11/01/14, has inhaler for bronchitis tx  . Edema   . Morbid obesity (HCC)   . Other and unspecified hyperlipidemia    no meds  . Unspecified hypothyroidism    Past  Surgical History:  Procedure Laterality Date  . EYE SURGERY     lazy eye as a child  . LAPAROTOMY N/A 11/17/2014   Procedure: EXPLORATORY LAPAROTOMY, chromopertubation;  Surgeon: Turner Daniels, MD;  Location: [facility];  Service: Gynecology;  Laterality: N/A;  . LYSIS OF ADHESION N/A 11/17/2014   Procedure: LYSIS OF ADHESION;  Surgeon: Turner Daniels, MD;  Location: [facility];  Service: Gynecology;  Laterality: N/A;  . OVARIAN CYST REMOVAL Right 11/17/2014   Procedure: OVARIAN CYSTECTOMY;  Surgeon: Turner Daniels, MD;  Location: [facility];  Service: Gynecology;  Laterality: Right;  . WISDOM TOOTH EXTRACTION     Social History   Tobacco Use  . Smoking status: Former Smoker    Packs/day: 0.25    Years: 4.00    Pack years: 1.00    Types: Cigarettes    Last attempt to quit: 12/18/1998    Years since quitting: 19.9  . Smokeless tobacco: Never Used  . Tobacco comment: Quit in college and was not an everyday smoker  Substance Use Topics  . Alcohol use: Yes    Alcohol/week: 0.0 standard drinks    Comment: socially  . Drug use: No   Family History  Problem Relation Age of Onset  .  Hypertension Maternal Grandmother   . Cancer Maternal Grandfather   . COPD Maternal Grandfather   . Stroke Paternal Grandfather   . COPD Paternal Grandfather    Allergies  Allergen Reactions  . Erythromycin Nausea And Vomiting    REACTION: n \\T \ v  . Penicillins     REACTION: mom has rxn Patient has never taken PCN Her mom can take cephalosporins without problems  . Soy Allergy Hives   Current Outpatient Medications on File Prior to Visit  Medication Sig Dispense Refill  . Fluticasone-Salmeterol (ADVAIR DISKUS) 100-50 MCG/DOSE AEPB INHALE 1 PUFF INTO THE LUNGS TWICE DAILY 60 each 11  . ibuprofen (ADVIL,MOTRIN) 600 MG tablet Take 1 tablet (600 mg total) by mouth every 6 (six) hours as needed (mild pain). 30 tablet 0  . norethindrone (AYGESTIN) 5 MG tablet Take 1 tablet by mouth 3 (three) times daily.     Marland Kitchen. SYNTHROID  175 MCG tablet TAKE ONE TABLET BY MOUTH EVERY MORNING BEFORE BREAKFAST **needs an appt before future refills are given** 30 tablet 1   No current facility-administered medications on file prior to visit.      Review of Systems  Constitutional: Negative for activity change, appetite change, fatigue, fever and unexpected weight change.  HENT: Negative for congestion, ear pain, rhinorrhea, sinus pressure and sore throat.   Eyes: Negative for pain, redness and visual disturbance.  Respiratory: Negative for cough, shortness of breath and wheezing.   Cardiovascular: Negative for chest pain and palpitations.  Gastrointestinal: Negative for abdominal pain, blood in stool, constipation and diarrhea.  Endocrine: Negative for polydipsia and polyuria.  Genitourinary: Negative for dysuria, frequency and urgency.  Musculoskeletal: Negative for arthralgias, back pain and myalgias.  Skin: Negative for pallor and rash.  Allergic/Immunologic: Negative for environmental allergies.  Neurological: Negative for dizziness, syncope and headaches.  Hematological: Negative for adenopathy. Does not bruise/bleed easily.  Psychiatric/Behavioral: Negative for decreased concentration and dysphoric mood. The patient is not nervous/anxious.        Objective:   Physical Exam  Constitutional: She appears well-developed and well-nourished. No distress.  obese and well appearing   HENT:  Head: Normocephalic and atraumatic.  Mouth/Throat: Oropharynx is clear and moist.  Eyes: Pupils are equal, round, and reactive to light. Conjunctivae and EOM are normal.  Neck: Normal range of motion. Neck supple. No JVD present. Carotid bruit is not present. No thyromegaly present.  Cardiovascular: Normal rate, regular rhythm, normal heart sounds and intact distal pulses. Exam reveals no gallop.  Pulmonary/Chest: Effort normal and breath sounds normal. No respiratory distress. She has no wheezes. She has no rales.  No crackles    Abdominal: Soft. Bowel sounds are normal. She exhibits no distension, no abdominal bruit and no mass. There is no tenderness.  Musculoskeletal: She exhibits no edema.  Lymphadenopathy:    She has no cervical adenopathy.  Neurological: She is alert. She has normal reflexes.  Skin: Skin is warm and dry. No rash noted.  Psychiatric: She has a normal mood and affect.  Pleasant           Assessment & Plan:   Problem List Items Addressed This Visit      Endocrine   Hypothyroidism - Primary    Hypothyroidism  Pt has no clinical changes on levothyroxine 175 mcg daily No change in energy level/ hair or skin/ edema and no tremor TSH today        Relevant Medications   SYNTHROID 175 MCG tablet   Other Relevant Orders  TSH (Completed)     Other   OBESITY, MORBID    Discussed how this problem influences overall health and the risks it imposes  Reviewed plan for weight loss with lower calorie diet (via better food choices and also portion control or program like weight watchers) and exercise building up to or more than 30 minutes 5 days per week including some aerobic activity         Hyperlipidemia    Disc goals for lipids and reasons to control them Was lost to f/u for last lipids  Rev last labs with pt Rev low sat fat diet in detail  Lab today       Relevant Orders   Lipid panel (Completed)   Comprehensive metabolic panel (Completed)   Bronchospasm    Well controlled with advair  Refilled        Other Visit Diagnoses    Need for influenza vaccination       Relevant Orders   Flu Vaccine QUAD 36+ mos IM (Completed)

## 2018-11-13 NOTE — Patient Instructions (Signed)
Take care of yourself   Try to get most of your carbohydrates from produce (with the exception of white potatoes)  Eat less bread/pasta/rice/snack foods/cereals/sweets and other items from the middle of the grocery store (processed carbs) (I like this better than keto)   Also exercise 5 days per week for at least 30 minutes   For cholesterol  Avoid red meat/ fried foods/ egg yolks/ fatty breakfast meats/ butter, cheese and high fat dairy/ and shellfish    Labs today  Flu shot today

## 2018-11-14 LAB — COMPREHENSIVE METABOLIC PANEL
AG Ratio: 1.5 (calc) (ref 1.0–2.5)
ALT: 32 U/L — AB (ref 6–29)
AST: 20 U/L (ref 10–30)
Albumin: 4.4 g/dL (ref 3.6–5.1)
Alkaline phosphatase (APISO): 49 U/L (ref 33–115)
BUN: 13 mg/dL (ref 7–25)
CO2: 26 mmol/L (ref 20–32)
CREATININE: 1.09 mg/dL (ref 0.50–1.10)
Calcium: 9.9 mg/dL (ref 8.6–10.2)
Chloride: 106 mmol/L (ref 98–110)
GLOBULIN: 2.9 g/dL (ref 1.9–3.7)
GLUCOSE: 81 mg/dL (ref 65–99)
Potassium: 4.4 mmol/L (ref 3.5–5.3)
SODIUM: 142 mmol/L (ref 135–146)
TOTAL PROTEIN: 7.3 g/dL (ref 6.1–8.1)
Total Bilirubin: 0.5 mg/dL (ref 0.2–1.2)

## 2018-11-14 LAB — LIPID PANEL
Cholesterol: 219 mg/dL — ABNORMAL HIGH (ref <200)
HDL: 35 mg/dL — ABNORMAL LOW (ref >50)
LDL Cholesterol (Calc): 153 mg/dL (calc) — ABNORMAL HIGH
Non-HDL Cholesterol (Calc): 184 mg/dL (calc) — ABNORMAL HIGH (ref <130)
Total CHOL/HDL Ratio: 6.3 (calc) — ABNORMAL HIGH (ref <5.0)
Triglycerides: 176 mg/dL — ABNORMAL HIGH (ref <150)

## 2018-11-14 LAB — TSH: TSH: 0.77 m[IU]/L

## 2018-11-15 NOTE — Assessment & Plan Note (Signed)
Discussed how this problem influences overall health and the risks it imposes  Reviewed plan for weight loss with lower calorie diet (via better food choices and also portion control or program like weight watchers) and exercise building up to or more than 30 minutes 5 days per week including some aerobic activity    

## 2018-11-15 NOTE — Assessment & Plan Note (Signed)
Well controlled with advair  Refilled

## 2018-11-15 NOTE — Assessment & Plan Note (Signed)
Disc goals for lipids and reasons to control them Was lost to f/u for last lipids  Rev last labs with pt Rev low sat fat diet in detail  Lab today

## 2018-11-15 NOTE — Assessment & Plan Note (Signed)
Hypothyroidism  Pt has no clinical changes on levothyroxine 175 mcg daily No change in energy level/ hair or skin/ edema and no tremor TSH today

## 2018-11-16 ENCOUNTER — Encounter: Payer: Self-pay | Admitting: Family Medicine

## 2018-11-19 MED ORDER — ATORVASTATIN CALCIUM 10 MG PO TABS: 10.0000 mg | ORAL_TABLET | Freq: Every day | ORAL | 11 refills | Status: DC

## 2018-11-19 NOTE — Telephone Encounter (Signed)
I sent atorvastatin to her pharmacy  Please schedule fasting lab in 6 wk for lipid/ast/alt   Alert me if side effects or problems

## 2018-11-20 NOTE — Telephone Encounter (Signed)
Lab appt scheduled and pt notified of Dr. Royden Purlower's comments

## 2019-01-03 ENCOUNTER — Telehealth: Payer: Self-pay | Admitting: Family Medicine

## 2019-01-03 DIAGNOSIS — E7800 Pure hypercholesterolemia, unspecified: Secondary | ICD-10-CM

## 2019-01-03 NOTE — Telephone Encounter (Signed)
-----   Message from Wendi Maya, RT sent at 12/30/2018  9:12 AM EST ----- Regarding: Lab orders for 01/06/19 Please enter lab orders for 01/06/19. Schedule states that patient is coming in for ast, alt, and lipid. Thanks!

## 2019-01-06 ENCOUNTER — Other Ambulatory Visit (INDEPENDENT_AMBULATORY_CARE_PROVIDER_SITE_OTHER): Payer: BLUE CROSS/BLUE SHIELD

## 2019-01-06 ENCOUNTER — Other Ambulatory Visit: Payer: BLUE CROSS/BLUE SHIELD

## 2019-01-06 DIAGNOSIS — E7800 Pure hypercholesterolemia, unspecified: Secondary | ICD-10-CM | POA: Diagnosis not present

## 2019-01-06 LAB — LIPID PANEL
CHOL/HDL RATIO: 4
Cholesterol: 126 mg/dL (ref 0–200)
HDL: 29 mg/dL — AB (ref >39.00)
LDL Cholesterol: 71 mg/dL (ref 0–99)
NONHDL: 96.63
Triglycerides: 128 mg/dL (ref 0.0–149.0)
VLDL: 25.6 mg/dL (ref 0.0–40.0)

## 2019-01-06 LAB — AST: AST: 19 U/L (ref 0–37)

## 2019-01-06 LAB — ALT: ALT: 29 U/L (ref 0–35)

## 2019-11-13 ENCOUNTER — Other Ambulatory Visit: Payer: Self-pay | Admitting: Family Medicine

## 2019-11-15 ENCOUNTER — Encounter: Payer: Self-pay | Admitting: Family Medicine

## 2019-11-19 MED ORDER — SYNTHROID 175 MCG PO TABS: ORAL_TABLET | ORAL | 5 refills | Status: DC

## 2019-11-22 ENCOUNTER — Other Ambulatory Visit: Payer: Self-pay | Admitting: Family Medicine

## 2020-02-06 ENCOUNTER — Telehealth: Payer: Self-pay

## 2020-02-06 NOTE — Telephone Encounter (Signed)
LVM w COVID screen, front door and back lab 2.19.2021 TLJ

## 2020-02-10 ENCOUNTER — Telehealth: Payer: Self-pay | Admitting: Family Medicine

## 2020-02-10 DIAGNOSIS — E7800 Pure hypercholesterolemia, unspecified: Secondary | ICD-10-CM

## 2020-02-10 DIAGNOSIS — E039 Hypothyroidism, unspecified: Secondary | ICD-10-CM

## 2020-02-10 NOTE — Telephone Encounter (Signed)
-----   Message from Aquilla Solian, RT sent at 01/29/2020  2:11 PM EST ----- Regarding: Lab Orders for Wednesday 2.24.2021 Lab orders please for 3 mo f/u on Wednesday 2.24.2021, office visit on Wednesday 3.3.2021 Thank you, Jones Bales RT(R)

## 2020-02-11 ENCOUNTER — Other Ambulatory Visit (INDEPENDENT_AMBULATORY_CARE_PROVIDER_SITE_OTHER): Payer: No Typology Code available for payment source

## 2020-02-11 ENCOUNTER — Other Ambulatory Visit: Payer: Self-pay

## 2020-02-11 DIAGNOSIS — E7800 Pure hypercholesterolemia, unspecified: Secondary | ICD-10-CM | POA: Diagnosis not present

## 2020-02-11 DIAGNOSIS — E039 Hypothyroidism, unspecified: Secondary | ICD-10-CM | POA: Diagnosis not present

## 2020-02-11 LAB — LIPID PANEL
Cholesterol: 127 mg/dL (ref 0–200)
HDL: 30.5 mg/dL — ABNORMAL LOW (ref >39.00)
LDL Cholesterol: 73 mg/dL (ref 0–99)
NonHDL: 96.42
Total CHOL/HDL Ratio: 4
Triglycerides: 116 mg/dL (ref 0.0–149.0)
VLDL: 23.2 mg/dL (ref 0.0–40.0)

## 2020-02-11 LAB — TSH: TSH: 0.45 u[IU]/mL (ref 0.35–4.50)

## 2020-02-11 LAB — COMPREHENSIVE METABOLIC PANEL
ALT: 28 U/L (ref 0–35)
AST: 17 U/L (ref 0–37)
Albumin: 3.9 g/dL (ref 3.5–5.2)
Alkaline Phosphatase: 43 U/L (ref 39–117)
BUN: 15 mg/dL (ref 6–23)
CO2: 24 mEq/L (ref 19–32)
Calcium: 9.3 mg/dL (ref 8.4–10.5)
Chloride: 108 mEq/L (ref 96–112)
Creatinine, Ser: 0.95 mg/dL (ref 0.40–1.20)
GFR: 64.42 mL/min (ref >60.00)
Glucose, Bld: 88 mg/dL (ref 70–99)
Potassium: 4.4 mEq/L (ref 3.5–5.1)
Sodium: 140 mEq/L (ref 135–145)
Total Bilirubin: 0.4 mg/dL (ref 0.2–1.2)
Total Protein: 6.6 g/dL (ref 6.0–8.3)

## 2020-02-18 ENCOUNTER — Ambulatory Visit (INDEPENDENT_AMBULATORY_CARE_PROVIDER_SITE_OTHER): Payer: No Typology Code available for payment source | Admitting: Family Medicine

## 2020-02-18 ENCOUNTER — Other Ambulatory Visit: Payer: Self-pay

## 2020-02-18 ENCOUNTER — Encounter: Payer: Self-pay | Admitting: Family Medicine

## 2020-02-18 VITALS — BP 136/74 | HR 92 | Temp 98.1°F | Ht 67.0 in | Wt 344.2 lb

## 2020-02-18 DIAGNOSIS — J9801 Acute bronchospasm: Secondary | ICD-10-CM

## 2020-02-18 DIAGNOSIS — E7800 Pure hypercholesterolemia, unspecified: Secondary | ICD-10-CM

## 2020-02-18 DIAGNOSIS — E039 Hypothyroidism, unspecified: Secondary | ICD-10-CM

## 2020-02-18 DIAGNOSIS — N809 Endometriosis, unspecified: Secondary | ICD-10-CM

## 2020-02-18 NOTE — Assessment & Plan Note (Signed)
advair controls symptoms well  No flares lately

## 2020-02-18 NOTE — Assessment & Plan Note (Signed)
Hypothyroidism  Pt has no clinical changes No change in energy level/ hair or skin/ edema and no tremor Lab Results  Component Value Date   TSH 0.45 02/11/2020

## 2020-02-18 NOTE — Assessment & Plan Note (Signed)
Pt is in the process of finding a new specialist  Continues progesterone daily and has no menses Progesterone makes her breasts tender so she is not ready to try a mammogram

## 2020-02-18 NOTE — Assessment & Plan Note (Signed)
Stable with atorvastatin  Disc goals for lipids and reasons to control them Rev last labs with pt Rev low sat fat diet in detail HDL is low-unfortunately no time for exercise

## 2020-02-18 NOTE — Progress Notes (Signed)
Subjective:    Patient ID: Nancy Dunlap, female    DOB: [birth year], 44 y.o.   MRN: [id number]  This visit occurred during the SARS-CoV-2 public health emergency.  Safety protocols were in place, including screening questions prior to the visit, additional usage of staff PPE, and extensive cleaning of exam room while observing appropriate contact time as indicated for disinfecting solutions.    HPI Pt presents to f/u for chronic health conditions   Wt Readings from Last 3 Encounters:  02/18/20 (!) 344 lb 3 oz (156.1 kg)  11/13/18 (!) 342 lb 4 oz (155.2 kg)  12/12/17 (!) 344 lb 12 oz (156.4 kg)   53.91 kg/m  Got a better job  Her mother now lives with her - needs a lot of care/ is bed ridden  No time for self care   BP Readings from Last 3 Encounters:  02/18/20 136/74  11/13/18 130/80  12/12/17 (!) 134/96     Pulse Readings from Last 3 Encounters:  02/18/20 92  11/13/18 89  12/12/17 83   Plans to get the covid vaccine    Hypothyroidism  Pt has no clinical changes No change in energy level/ hair or skin/ edema and no tremor Lab Results  Component Value Date   TSH 0.45 02/11/2020    She takes levothyroxine 175 mcg daily    Hyperlipidemia Lab Results  Component Value Date   CHOL 127 02/11/2020   CHOL 126 01/06/2019   CHOL 219 (H) 11/13/2018   Lab Results  Component Value Date   HDL 30.50 (L) 02/11/2020   HDL 29.00 (L) 01/06/2019   HDL 35 (L) 11/13/2018   Lab Results  Component Value Date   LDLCALC 73 02/11/2020   LDLCALC 71 01/06/2019   LDLCALC 153 (H) 11/13/2018   Lab Results  Component Value Date   TRIG 116.0 02/11/2020   TRIG 128.0 01/06/2019   TRIG 176 (H) 11/13/2018   Lab Results  Component Value Date   CHOLHDL 4 02/11/2020   CHOLHDL 4 01/06/2019   CHOLHDL 6.3 (H) 11/13/2018   Lab Results  Component Value Date   LDLDIRECT 126.4 04/10/2012   LDLDIRECT 135.3 10/28/2010   LDLDIRECT 139.3 09/30/2007  taking atorvastatin  Diet  She  has not eaten fast food in over a year No time for exercise - no time at all    H/o bronchospasm - uses advair and no big flares since last visit   Has not had a mammogram  Her breasts are sore all the time due to endometriosis   She was seeing a Research officer, trade [employer]- 3 y ago  Due to follow up  She takes norethindrone- and is interested in finding a new doctor (endometriosis specialist)  Taking progesterone daily  No periods at all   Patient Active Problem List   Diagnosis Date Noted  . Endometriosis 12/12/2017  . Bronchospasm 12/12/2017  . S/P laparotomy 11/17/2014  . Amenorrhea 03/17/2011  . Routine general medical examination at a health care facility 03/16/2011  . Hyperlipidemia 01/03/2010  . Hypothyroidism 10/07/2007  . OBESITY, MORBID 09/30/2007  . LEG EDEMA 09/30/2007   Past Medical History:  Diagnosis Date  . Bronchitis    just finished abx  11/01/14, has inhaler for bronchitis tx  . Edema   . Morbid obesity (HCC)   . Other and unspecified hyperlipidemia    no meds  . Unspecified hypothyroidism    Past Surgical History:  Procedure Laterality Date  . EYE SURGERY  lazy eye as a child  . LAPAROTOMY N/A 11/17/2014   Procedure: EXPLORATORY LAPAROTOMY, chromopertubation;  Surgeon: Turner Daniels, MD;  Location: [facility];  Service: Gynecology;  Laterality: N/A;  . LYSIS OF ADHESION N/A 11/17/2014   Procedure: LYSIS OF ADHESION;  Surgeon: Turner Daniels, MD;  Location: [facility];  Service: Gynecology;  Laterality: N/A;  . OVARIAN CYST REMOVAL Right 11/17/2014   Procedure: OVARIAN CYSTECTOMY;  Surgeon: Turner Daniels, MD;  Location: [facility];  Service: Gynecology;  Laterality: Right;  . WISDOM TOOTH EXTRACTION     Social History   Tobacco Use  . Smoking status: Former Smoker    Packs/day: 0.25    Years: 4.00    Pack years: 1.00    Types: Cigarettes    Quit date: 12/18/1998    Years since quitting: 21.1  . Smokeless tobacco: Never Used  . Tobacco comment: Quit in college and was not  an everyday smoker  Substance Use Topics  . Alcohol use: Yes    Alcohol/week: 0.0 standard drinks    Comment: socially  . Drug use: No   Family History  Problem Relation Age of Onset  . Hypertension Maternal Grandmother   . Cancer Maternal Grandfather   . COPD Maternal Grandfather   . Stroke Paternal Grandfather   . COPD Paternal Grandfather    Allergies  Allergen Reactions  . Erythromycin Nausea And Vomiting    REACTION: n \\T \ v  . Penicillins     REACTION: mom has rxn Patient has never taken PCN Her mom can take cephalosporins without problems  . Soy Allergy Hives   Current Outpatient Medications on File Prior to Visit  Medication Sig Dispense Refill  . atorvastatin (LIPITOR) 10 MG tablet TAKE ONE TABLET BY MOUTH DAILY 30 tablet 10  . Fluticasone-Salmeterol (ADVAIR) 100-50 MCG/DOSE AEPB INHALE ONE PUFF BY MOUTH TWICE A DAY 60 each 2  . ibuprofen (ADVIL,MOTRIN) 600 MG tablet Take 1 tablet (600 mg total) by mouth every 6 (six) hours as needed (mild pain). 30 tablet 0  . norethindrone (AYGESTIN) 5 MG tablet Take 1 tablet by mouth 3 (three) times daily.     Marland Kitchen SYNTHROID 175 MCG tablet TAKE ONE TABLET BY MOUTH EVERY MORNING BEFORE BREAKFAST 30 tablet 5   No current facility-administered medications on file prior to visit.     Review of Systems  Constitutional: Negative for activity change, appetite change, fatigue, fever and unexpected weight change.  HENT: Negative for congestion, ear pain, rhinorrhea, sinus pressure and sore throat.   Eyes: Negative for pain, redness and visual disturbance.  Respiratory: Negative for cough, shortness of breath and wheezing.   Cardiovascular: Negative for chest pain and palpitations.  Gastrointestinal: Negative for abdominal pain, blood in stool, constipation and diarrhea.  Endocrine: Negative for polydipsia and polyuria.  Genitourinary: Positive for pelvic pain. Negative for dysuria, frequency and urgency.       Severe pain if she gets a  period due to endometriosis   Musculoskeletal: Negative for arthralgias, back pain and myalgias.  Skin: Negative for pallor and rash.  Allergic/Immunologic: Negative for environmental allergies.  Neurological: Negative for dizziness, syncope and headaches.  Hematological: Negative for adenopathy. Does not bruise/bleed easily.  Psychiatric/Behavioral: Negative for decreased concentration and dysphoric mood. The patient is not nervous/anxious.        Objective:   Physical Exam Constitutional:      General: She is not in acute distress.    Appearance: Normal appearance. She is well-developed. She  is obese. She is not ill-appearing.  HENT:     Head: Normocephalic and atraumatic.     Mouth/Throat:     Mouth: Mucous membranes are moist.     Pharynx: Oropharynx is clear.  Eyes:     General: No scleral icterus.    Conjunctiva/sclera: Conjunctivae normal.     Pupils: Pupils are equal, round, and reactive to light.  Neck:     Thyroid: No thyromegaly.     Vascular: No carotid bruit or JVD.  Cardiovascular:     Rate and Rhythm: Regular rhythm. Tachycardia present.     Pulses: Normal pulses.     Heart sounds: Normal heart sounds. No gallop.   Pulmonary:     Effort: Pulmonary effort is normal. No respiratory distress.     Breath sounds: Normal breath sounds. No wheezing or rales.     Comments: Good air exch No wheeze even on forced expiration  Abdominal:     General: Bowel sounds are normal. There is no distension or abdominal bruit.     Palpations: Abdomen is soft. There is no mass.     Tenderness: There is no abdominal tenderness.     Hernia: No hernia is present.     Comments: No suprapubic tenderness or fullness    Musculoskeletal:     Cervical back: Normal range of motion and neck supple.     Right lower leg: No edema.     Left lower leg: No edema.  Lymphadenopathy:     Cervical: No cervical adenopathy.  Skin:    General: Skin is warm and dry.     Coloration: Skin is not  pale.     Findings: No erythema or rash.     Comments: Solar lentigines diffusely   Neurological:     Mental Status: She is alert. Mental status is at baseline.     Sensory: No sensory deficit.     Coordination: Coordination normal.     Deep Tendon Reflexes: Reflexes are normal and symmetric. Reflexes normal.  Psychiatric:        Mood and Affect: Mood normal.        Cognition and Memory: Cognition normal.     Comments: Pleasant            Assessment & Plan:   Problem List Items Addressed This Visit      Endocrine   Hypothyroidism - Primary    Hypothyroidism  Pt has no clinical changes No change in energy level/ hair or skin/ edema and no tremor Lab Results  Component Value Date   TSH 0.45 02/11/2020            Other   Hyperlipidemia    Stable with atorvastatin  Disc goals for lipids and reasons to control them Rev last labs with pt Rev low sat fat diet in detail HDL is low-unfortunately no time for exercise      OBESITY, MORBID    No time for healthy diet or exercise due to full time work and caring for her bed ridden mother who lives with her  Discussed how this problem influences overall health and the risks it imposes  Reviewed plan for weight loss with lower calorie diet (via better food choices and also portion control or program like weight watchers) and exercise building up to or more than 30 minutes 5 days per week including some aerobic activity         Endometriosis    Pt is in the process of  finding a new specialist  Continues progesterone daily and has no menses Progesterone makes her breasts tender so she is not ready to try a mammogram      Bronchospasm    advair controls symptoms well  No flares lately

## 2020-02-18 NOTE — Patient Instructions (Addendum)
Call us for a mammogram referral when you are ready to get one   Try and take care of yourself Eat a balanced healthy diet  Try to get most of your carbohydrates from produce (with the exception of white potatoes)  Eat less bread/pasta/rice/snack foods/cereals/sweets and other items from the middle of the grocery store (processed carbs)  Fit in a little exercise when you can -this will help your HDL/ good cholesterol   Thyroid is stable

## 2020-02-18 NOTE — Assessment & Plan Note (Signed)
No time for healthy diet or exercise due to full time work and caring for her bed ridden mother who lives with her  Discussed how this problem influences overall health and the risks it imposes  Reviewed plan for weight loss with lower calorie diet (via better food choices and also portion control or program like weight watchers) and exercise building up to or more than 30 minutes 5 days per week including some aerobic activity

## 2020-05-04 ENCOUNTER — Other Ambulatory Visit: Payer: Self-pay | Admitting: *Deleted

## 2020-05-04 MED ORDER — FLUTICASONE-SALMETEROL 100-50 MCG/DOSE IN AEPB: INHALATION_SPRAY | RESPIRATORY_TRACT | 2 refills | Status: DC

## 2020-05-21 ENCOUNTER — Other Ambulatory Visit: Payer: Self-pay | Admitting: Family Medicine

## 2020-08-10 ENCOUNTER — Other Ambulatory Visit: Payer: Self-pay | Admitting: Family Medicine

## 2020-10-08 ENCOUNTER — Other Ambulatory Visit: Payer: Self-pay

## 2020-10-08 ENCOUNTER — Encounter: Payer: Self-pay | Admitting: Emergency Medicine

## 2020-10-08 ENCOUNTER — Ambulatory Visit
Admission: EM | Admit: 2020-10-08 | Discharge: 2020-10-08 | Disposition: A | Discharge status: Home / Self Care | Payer: No Typology Code available for payment source | Attending: Family Medicine | Admitting: Family Medicine

## 2020-10-08 DIAGNOSIS — R5383 Other fatigue: Secondary | ICD-10-CM

## 2020-10-08 DIAGNOSIS — R6883 Chills (without fever): Secondary | ICD-10-CM

## 2020-10-08 DIAGNOSIS — Z1152 Encounter for screening for COVID-19: Secondary | ICD-10-CM

## 2020-10-08 DIAGNOSIS — R52 Pain, unspecified: Secondary | ICD-10-CM

## 2020-10-08 DIAGNOSIS — R059 Cough, unspecified: Secondary | ICD-10-CM

## 2020-10-08 DIAGNOSIS — B349 Viral infection, unspecified: Secondary | ICD-10-CM | POA: Diagnosis not present

## 2020-10-08 NOTE — Discharge Instructions (Signed)
Your COVID test is pending.  You should self quarantine until the test result is back.    Take Tylenol as needed for fever or discomfort.  Rest and keep yourself hydrated.    Go to the emergency department if you develop acute worsening symptoms.     

## 2020-10-08 NOTE — ED Provider Notes (Signed)
[facility]   [id number] 10/08/20 Arrival Time: 1327   CC: COVID symptoms  SUBJECTIVE: History from: patient.  Nancy Dunlap is a 44 y.o. female who presents with abrupt onset of nasal congestion, PND, fever, body aches, and cough since yesterday. Reports that husband has positive Covid. Denies recent travel. Has negative history of Covid. Has completed Covid vaccines. Has not taken OTC medications for this. There are no aggravating or alleviating factors. Denies previous symptoms in the past. Denies fever, sinus pain, rhinorrhea, sore throat, SOB, wheezing, chest pain, nausea, changes in bowel or bladder habits.    ROS: As per HPI.  All other pertinent ROS negative.     Past Medical History:  Diagnosis Date  . Bronchitis    just finished abx  11/01/14, has inhaler for bronchitis tx  . Edema   . Morbid obesity (HCC)   . Other and unspecified hyperlipidemia    no meds  . Unspecified hypothyroidism    Past Surgical History:  Procedure Laterality Date  . EYE SURGERY     lazy eye as a child  . LAPAROTOMY N/A 11/17/2014   Procedure: EXPLORATORY LAPAROTOMY, chromopertubation;  Surgeon: Turner Daniels, MD;  Location: [facility];  Service: Gynecology;  Laterality: N/A;  . LYSIS OF ADHESION N/A 11/17/2014   Procedure: LYSIS OF ADHESION;  Surgeon: Turner Daniels, MD;  Location: [facility];  Service: Gynecology;  Laterality: N/A;  . OVARIAN CYST REMOVAL Right 11/17/2014   Procedure: OVARIAN CYSTECTOMY;  Surgeon: Turner Daniels, MD;  Location: [facility];  Service: Gynecology;  Laterality: Right;  . WISDOM TOOTH EXTRACTION     Allergies  Allergen Reactions  . Erythromycin Nausea And Vomiting    REACTION: n \\T \ v  . Penicillins     REACTION: mom has rxn Patient has never taken PCN Her mom can take cephalosporins without problems  . Soy Allergy Hives   No current facility-administered medications on file prior to encounter.   Current Outpatient Medications on File Prior to Encounter    Medication Sig Dispense Refill  . atorvastatin (LIPITOR) 10 MG tablet TAKE ONE TABLET BY MOUTH DAILY 90 tablet 1  . norethindrone (AYGESTIN) 5 MG tablet Take 1 tablet by mouth 3 (three) times daily.     Marland Kitchen SYNTHROID 175 MCG tablet TAKE ONE TABLET BY MOUTH EVERY MORNING BEFORE BREAKFAST 30 tablet 5  . Fluticasone-Salmeterol (ADVAIR) 100-50 MCG/DOSE AEPB INHALE ONE PUFF BY MOUTH TWICE A DAY 60 each 2  . ibuprofen (ADVIL,MOTRIN) 600 MG tablet Take 1 tablet (600 mg total) by mouth every 6 (six) hours as needed (mild pain). 30 tablet 0   Social History   Socioeconomic History  . Marital status: Married    Spouse name: Not on file  . Number of children: Not on file  . Years of education: Not on file  . Highest education level: Not on file  Occupational History  . Occupation: Attorney-Travels for work  Tobacco Use  . Smoking status: Former Smoker    Packs/day: 0.25    Years: 4.00    Pack years: 1.00    Types: Cigarettes    Quit date: 12/18/1998    Years since quitting: 21.8  . Smokeless tobacco: Never Used  . Tobacco comment: Quit in college and was not an everyday smoker  Substance and Sexual Activity  . Alcohol use: Yes    Alcohol/week: 0.0 standard drinks    Comment: socially  . Drug use: No  . Sexual activity: Yes  Birth control/protection: None  Other Topics Concern  . Not on file  Social History Narrative  . Not on file   Social Determinants of Health   Financial Resource Strain:   . Difficulty of Paying Living Expenses: Not on file  Food Insecurity:   . Worried About Programme researcher, broadcasting/film/video in the Last Year: Not on file  . Ran Out of Food in the Last Year: Not on file  Transportation Needs:   . Lack of Transportation (Medical): Not on file  . Lack of Transportation (Non-Medical): Not on file  Physical Activity:   . Days of Exercise per Week: Not on file  . Minutes of Exercise per Session: Not on file  Stress:   . Feeling of Stress : Not on file  Social Connections:    . Frequency of Communication with Friends and Family: Not on file  . Frequency of Social Gatherings with Friends and Family: Not on file  . Attends Religious Services: Not on file  . Active Member of Clubs or Organizations: Not on file  . Attends Banker Meetings: Not on file  . Marital Status: Not on file  Intimate Partner Violence:   . Fear of Current or Ex-Partner: Not on file  . Emotionally Abused: Not on file  . Physically Abused: Not on file  . Sexually Abused: Not on file   Family History  Problem Relation Age of Onset  . Hypertension Maternal Grandmother   . Cancer Maternal Grandfather   . COPD Maternal Grandfather   . Stroke Paternal Grandfather   . COPD Paternal Grandfather     OBJECTIVE:  Vitals:   10/08/20 1458 10/08/20 1510  BP:  118/81  Pulse:  94  Resp:  18  Temp:  98.6 F (37 C)  TempSrc:  Oral  SpO2:  95%  Weight: (!) 360 lb (163.3 kg)   Height: 5\' 5"  (1.651 m)      General appearance: alert; appears fatigued, but nontoxic; speaking in full sentences and tolerating own secretions HEENT: NCAT; Ears: EACs clear, TMs pearly gray; Eyes: PERRL.  EOM grossly intact. Sinuses: nontender; Nose: nares patent without rhinorrhea, Throat: oropharynx clear, tonsils non erythematous or enlarged, uvula midline  Neck: supple without LAD Lungs: unlabored respirations, symmetrical air entry; cough: absent; no respiratory distress; CTAB Heart: regular rate and rhythm.  Radial pulses 2+ symmetrical bilaterally Skin: warm and dry Psychological: alert and cooperative; normal mood and affect  LABS:  No results found for this or any previous visit (from the past 24 hour(s)).   ASSESSMENT & PLAN:  1. Viral illness   2. Cough   3. Encounter for screening for COVID-19   4. Body aches   5. Other fatigue   6. Chills    Flu testing ordered  COVID testing ordered.  It will take between 1-2 days for test results.  Someone will contact you regarding abnormal  results.  Work note provided  Patient should remain in quarantine until they have received Covid results.  If negative you may resume normal activities (go back to work/school) while practicing hand hygiene, social distance, and mask wearing.  If positive, patient should remain in quarantine for 10 days from symptom onset AND greater than 72 hours after symptoms resolution (absence of fever without the use of fever-reducing medication and improvement in respiratory symptoms), whichever is longer Get plenty of rest and push fluids Use OTC zyrtec for nasal congestion, runny nose, and/or sore throat Use OTC flonase for nasal congestion  and runny nose Use medications daily for symptom relief Use OTC medications like ibuprofen or tylenol as needed fever or pain Call or go to the ED if you have any new or worsening symptoms such as fever, worsening cough, shortness of breath, chest tightness, chest pain, turning blue, changes in mental status.  Reviewed expectations re: course of current medical issues. Questions answered. Outlined signs and symptoms indicating need for more acute intervention. Patient verbalized understanding. After Visit Summary given.         Moshe Cipro, NP 10/08/20 1519

## 2020-10-08 NOTE — ED Triage Notes (Signed)
Patient c/o low grade fever, body aches, and cough x yesterday.   Patient stated her husband tested positive for COVID-19.   Patient had the J &J vaccine in April 2021.   History of Endometriosis and Thyroid Disease.

## 2020-10-09 ENCOUNTER — Telehealth (HOSPITAL_COMMUNITY): Payer: Self-pay

## 2020-10-09 ENCOUNTER — Other Ambulatory Visit (HOSPITAL_COMMUNITY): Payer: Self-pay | Admitting: Family

## 2020-10-09 DIAGNOSIS — U071 COVID-19: Secondary | ICD-10-CM

## 2020-10-09 LAB — COVID-19, FLU A+B AND RSV
Influenza A, NAA: NOT DETECTED
Influenza B, NAA: NOT DETECTED
RSV, NAA: NOT DETECTED
SARS-CoV-2, NAA: DETECTED — AB

## 2020-10-09 NOTE — Telephone Encounter (Signed)
Called to Discuss with patient about Covid symptoms and the use of the monoclonal antibody infusion for those with mild to moderate Covid symptoms and at a high risk of hospitalization.     Pt appears to qualify for this infusion due to co-morbid conditions and/or a member of an at-risk group in accordance with the FDA Emergency Use Authorization.    Patient interested in treatment.   Patient pre-screen by RN and ready for APP. Sx onset 10/21; test (+) 10/22 at [facility] in [place]; Risk factor: BMI over 25, Bronchitis.

## 2020-10-09 NOTE — Progress Notes (Signed)
I connected by phone with Nancy Dunlap on 10/09/2020 at 7:09 PM to discuss the potential use of a new treatment for mild to moderate COVID-19 viral infection in non-hospitalized patients.  This patient is a 44 y.o. female that meets the FDA criteria for Emergency Use Authorization of COVID monoclonal antibody casirivimab/imdevimab or bamlanivimab/eteseviamb.  Has a (+) direct SARS-CoV-2 viral test result  Has mild or moderate COVID-19   Is NOT hospitalized due to COVID-19  Is within 10 days of symptom onset  Has at least one of the high risk factor(s) for progression to severe COVID-19 and/or hospitalization as defined in EUA.  Specific high risk criteria : BMI > 25 and Chronic Lung Disease   Symptoms of sore throat, H/A, aches, and cough began 10/07/20.   I have spoken and communicated the following to the patient or parent/caregiver regarding COVID monoclonal antibody treatment:  1. FDA has authorized the emergency use for the treatment of mild to moderate COVID-19 in adults and pediatric patients with positive results of direct SARS-CoV-2 viral testing who are 22 years of age and older weighing at least 40 kg, and who are at high risk for progressing to severe COVID-19 and/or hospitalization.  2. The significant known and potential risks and benefits of COVID monoclonal antibody, and the extent to which such potential risks and benefits are unknown.  3. Information on available alternative treatments and the risks and benefits of those alternatives, including clinical trials.  4. Patients treated with COVID monoclonal antibody should continue to self-isolate and use infection control measures (e.g., wear mask, isolate, social distance, avoid sharing personal items, clean and disinfect "high touch" surfaces, and frequent handwashing) according to CDC guidelines.   5. The patient or parent/caregiver has the option to accept or refuse COVID monoclonal antibody treatment.  After  reviewing this information with the patient, the patient has agreed to receive one of the available covid 19 monoclonal antibodies and will be provided an appropriate fact sheet prior to infusion. Morton Stall, NP 10/09/2020 7:09 PM

## 2020-10-10 ENCOUNTER — Ambulatory Visit (HOSPITAL_COMMUNITY)
Admission: RE | Admit: 2020-10-10 | Discharge: 2020-10-10 | Disposition: A | Discharge status: Home / Self Care | Payer: No Typology Code available for payment source | Source: Ambulatory Visit | Attending: Pulmonary Disease | Admitting: Pulmonary Disease

## 2020-10-10 DIAGNOSIS — J989 Respiratory disorder, unspecified: Secondary | ICD-10-CM | POA: Insufficient documentation

## 2020-10-10 DIAGNOSIS — U071 COVID-19: Secondary | ICD-10-CM | POA: Insufficient documentation

## 2020-10-10 MED ORDER — SODIUM CHLORIDE 0.9 % IV SOLN: INTRAVENOUS | Status: DC | PRN

## 2020-10-10 MED ORDER — FAMOTIDINE IN NACL 20-0.9 MG/50ML-% IV SOLN: 20.0000 mg | Freq: Once | INTRAVENOUS | Status: DC | PRN

## 2020-10-10 MED ORDER — DIPHENHYDRAMINE HCL 50 MG/ML IJ SOLN: 50.0000 mg | Freq: Once | INTRAMUSCULAR | Status: DC | PRN

## 2020-10-10 MED ORDER — SODIUM CHLORIDE 0.9 % IV SOLN: Freq: Once | INTRAVENOUS | Status: AC

## 2020-10-10 MED ORDER — METHYLPREDNISOLONE SODIUM SUCC 125 MG IJ SOLR: 125.0000 mg | Freq: Once | INTRAMUSCULAR | Status: DC | PRN

## 2020-10-10 MED ORDER — ACETAMINOPHEN 325 MG PO TABS
650.0000 mg | ORAL_TABLET | Freq: Once | ORAL | Status: AC
  Administered 2020-10-10: 650 mg via ORAL
  Filled 2020-10-10: qty 2

## 2020-10-10 MED ORDER — EPINEPHRINE 0.3 MG/0.3ML IJ SOAJ: 0.3000 mg | Freq: Once | INTRAMUSCULAR | Status: DC | PRN

## 2020-10-10 MED ORDER — ALBUTEROL SULFATE HFA 108 (90 BASE) MCG/ACT IN AERS: 2.0000 | INHALATION_SPRAY | Freq: Once | RESPIRATORY_TRACT | Status: DC | PRN

## 2020-10-10 NOTE — Progress Notes (Signed)
  Diagnosis: COVID-19  Physician: Tower   Procedure: Cov[facility] Infusion [facility] Med: bamlanivimab\etesevimab infusion - Provided patient with bamlanimivab\etesevimab fact sheet for patients, parents and caregivers prior to infusion.  Complications: No immediate complications noted.  Discharge: Discharged home   Annice Pih 10/10/2020

## 2020-10-10 NOTE — Discharge Instructions (Signed)

## 2020-10-26 ENCOUNTER — Other Ambulatory Visit: Payer: Self-pay | Admitting: Family Medicine

## 2020-11-18 ENCOUNTER — Other Ambulatory Visit: Payer: Self-pay | Admitting: Family Medicine

## 2021-02-10 ENCOUNTER — Other Ambulatory Visit: Payer: Self-pay | Admitting: Family Medicine

## 2021-02-10 NOTE — Telephone Encounter (Signed)
Med refilled once and Carrie will reach out to pt to try and get appt scheduled  

## 2021-02-10 NOTE — Telephone Encounter (Signed)
Pt hasn't been seen in almost a year and no future appts., please advise  

## 2021-02-10 NOTE — Telephone Encounter (Signed)
Please schedule PE or f/u and refill until then 

## 2021-02-11 NOTE — Telephone Encounter (Signed)
I left a message on patient's voice mail to return my call.  Patient needs to schedule a cpx or follow up appointment with Dr.Tower.

## 2021-02-17 ENCOUNTER — Other Ambulatory Visit: Payer: Self-pay | Admitting: Family Medicine

## 2021-02-17 NOTE — Telephone Encounter (Signed)
See prev refill request, Lyla Son left VMs asking pt to call and schedule appt. (no appt scheduled)

## 2021-02-18 ENCOUNTER — Encounter: Payer: Self-pay | Admitting: Family Medicine

## 2021-02-18 MED ORDER — SYNTHROID 175 MCG PO TABS: 175.0000 ug | ORAL_TABLET | Freq: Every day | ORAL | 0 refills | Status: DC

## 2021-03-11 ENCOUNTER — Ambulatory Visit: Payer: No Typology Code available for payment source | Admitting: Family Medicine

## 2021-03-17 ENCOUNTER — Other Ambulatory Visit: Payer: Self-pay | Admitting: Family Medicine

## 2021-03-25 ENCOUNTER — Other Ambulatory Visit: Payer: Self-pay

## 2021-03-25 ENCOUNTER — Ambulatory Visit (INDEPENDENT_AMBULATORY_CARE_PROVIDER_SITE_OTHER): Payer: No Typology Code available for payment source | Admitting: Family Medicine

## 2021-03-25 ENCOUNTER — Encounter: Payer: Self-pay | Admitting: Family Medicine

## 2021-03-25 VITALS — BP 135/88 | HR 92 | Temp 96.9°F | Ht 67.0 in | Wt 383.1 lb

## 2021-03-25 DIAGNOSIS — E7800 Pure hypercholesterolemia, unspecified: Secondary | ICD-10-CM | POA: Diagnosis not present

## 2021-03-25 DIAGNOSIS — E559 Vitamin D deficiency, unspecified: Secondary | ICD-10-CM | POA: Insufficient documentation

## 2021-03-25 DIAGNOSIS — R7401 Elevation of levels of liver transaminase levels: Secondary | ICD-10-CM

## 2021-03-25 DIAGNOSIS — E039 Hypothyroidism, unspecified: Secondary | ICD-10-CM

## 2021-03-25 DIAGNOSIS — F4320 Adjustment disorder, unspecified: Secondary | ICD-10-CM | POA: Insufficient documentation

## 2021-03-25 DIAGNOSIS — F4321 Adjustment disorder with depressed mood: Secondary | ICD-10-CM | POA: Diagnosis not present

## 2021-03-25 MED ORDER — LEVOTHYROXINE SODIUM 200 MCG PO TABS: 200.0000 ug | ORAL_TABLET | Freq: Every day | ORAL | 3 refills | Status: DC

## 2021-03-25 NOTE — Progress Notes (Signed)
Subjective:    Patient ID: Nancy Dunlap, female    DOB: 02/11/1976, 45 y.o.   MRN: [id number]  This visit occurred during the SARS-CoV-2 public health emergency.  Safety protocols were in place, including screening questions prior to the visit, additional usage of staff PPE, and extensive cleaning of exam room while observing appropriate contact time as indicated for disinfecting solutions.    HPI Pt presents for f/u of chronic medical problems   Wt Readings from Last 3 Encounters:  03/25/21 (!) 383 lb 2 oz (173.8 kg)  10/08/20 (!) 360 lb (163.3 kg)  02/18/20 (!) 344 lb 3 oz (156.1 kg)   60.01 kg/m  Takes orilissa (xenical)  Last year was terrible  Lost FIL and mom and dog  Also gm  covid related  She had covid (she had covid also during all of this)   Grief - has not had counseling  Her gyn also suggested anxiety medicine    covid immunized  In oct she had covid  Ok with getting booster   Coming off progesterone now  Endometriosis is stable -is with a sub specialist now   Vitamin d level 21.9  mvi has some     Hypothyroidism Lab Results  Component Value Date   TSH 0.45 02/11/2020   Taking levothyroxine 175 mcg daily   Had labs 3/13 at gyn and TSH was 6.37 and FT4 was 1.12   Hyperlipidemia Lab Results  Component Value Date   CHOL 127 02/11/2020   HDL 30.50 (L) 02/11/2020   LDLCALC 73 02/11/2020   LDLDIRECT 126.4 04/10/2012   TRIG 116.0 02/11/2020   CHOLHDL 4 02/11/2020   Taking atorvastatin 10 mg daily  Diet   H/o reactive airways  Uses generic advair 100-50   BP Readings from Last 3 Encounters:  03/25/21 135/88  10/10/20 139/83  10/08/20 118/81    Pulse Readings from Last 3 Encounters:  03/25/21 92  10/10/20 85  10/08/20 94   AST and ALT are mildly elevated 46 and 84  No tylenol Very rare etoh   Suspect fatty liver  Brother has it    Patient Active Problem List   Diagnosis Date Noted  . Vitamin D deficiency 03/25/2021   . Elevated liver transaminase level 03/25/2021  . Grief reaction 03/25/2021  . Endometriosis 12/12/2017  . Bronchospasm 12/12/2017  . S/P laparotomy 11/17/2014  . Amenorrhea 03/17/2011  . Routine general medical examination at a health care facility 03/16/2011  . Hyperlipidemia 01/03/2010  . Hypothyroidism 10/07/2007  . OBESITY, MORBID 09/30/2007  . LEG EDEMA 09/30/2007   Past Medical History:  Diagnosis Date  . Bronchitis    just finished abx  11/01/14, has inhaler for bronchitis tx  . Edema   . Morbid obesity (HCC)   . Other and unspecified hyperlipidemia    no meds  . Unspecified hypothyroidism    Past Surgical History:  Procedure Laterality Date  . EYE SURGERY     lazy eye as a child  . LAPAROTOMY N/A 11/17/2014   Procedure: EXPLORATORY LAPAROTOMY, chromopertubation;  Surgeon: Turner Daniels, MD;  Location: [facility];  Service: Gynecology;  Laterality: N/A;  . LYSIS OF ADHESION N/A 11/17/2014   Procedure: LYSIS OF ADHESION;  Surgeon: Turner Daniels, MD;  Location: [facility];  Service: Gynecology;  Laterality: N/A;  . OVARIAN CYST REMOVAL Right 11/17/2014   Procedure: OVARIAN CYSTECTOMY;  Surgeon: Turner Daniels, MD;  Location: [facility];  Service: Gynecology;  Laterality: Right;  .  WISDOM TOOTH EXTRACTION     Social History   Tobacco Use  . Smoking status: Former Smoker    Packs/day: 0.25    Years: 4.00    Pack years: 1.00    Types: Cigarettes    Quit date: 12/18/1998    Years since quitting: 22.2  . Smokeless tobacco: Never Used  . Tobacco comment: Quit in college and was not an everyday smoker  Substance Use Topics  . Alcohol use: Yes    Alcohol/week: 0.0 standard drinks    Comment: socially  . Drug use: No   Family History  Problem Relation Age of Onset  . Hypertension Maternal Grandmother   . Cancer Maternal Grandfather   . COPD Maternal Grandfather   . Stroke Paternal Grandfather   . COPD Paternal Grandfather    Allergies  Allergen Reactions  . Erythromycin Nausea  And Vomiting    REACTION: n \\T \ v  . Penicillins     REACTION: mom has rxn Patient has never taken PCN Her mom can take cephalosporins without problems  . Soy Allergy Hives   Current Outpatient Medications on File Prior to Visit  Medication Sig Dispense Refill  . atorvastatin (LIPITOR) 10 MG tablet TAKE ONE TABLET BY MOUTH DAILY 30 tablet 0  . Elagolix Sodium (ORILISSA) 150 MG TABS Take 1 tablet by mouth daily.    Marland Kitchen SYNTHROID 175 MCG tablet TAKE ONE TABLET BY MOUTH DAILY WITH BREAKFAST 30 tablet 0  . WIXELA INHUB 100-50 MCG/DOSE AEPB INHALE 1 PUFF BY MOUTH TWICE DAILY(RINSE MOUTH AFTER EACH USE) 60 each 2   No current facility-administered medications on file prior to visit.    Review of Systems  Constitutional: Positive for fatigue. Negative for activity change, appetite change, fever and unexpected weight change.  HENT: Negative for congestion, ear pain, rhinorrhea, sinus pressure and sore throat.   Eyes: Negative for pain, redness and visual disturbance.  Respiratory: Negative for cough, shortness of breath and wheezing.   Cardiovascular: Negative for chest pain and palpitations.  Gastrointestinal: Negative for abdominal pain, blood in stool, constipation and diarrhea.  Endocrine: Negative for polydipsia and polyuria.  Genitourinary: Negative for dysuria, frequency and urgency.  Musculoskeletal: Negative for arthralgias, back pain and myalgias.  Skin: Negative for pallor and rash.  Allergic/Immunologic: Negative for environmental allergies.  Neurological: Negative for dizziness, syncope and headaches.  Hematological: Negative for adenopathy. Does not bruise/bleed easily.  Psychiatric/Behavioral: Positive for decreased concentration and dysphoric mood. Negative for self-injury and suicidal ideas. The patient is nervous/anxious.        Objective:   Physical Exam Constitutional:      General: She is not in acute distress.    Appearance: Normal appearance. She is well-developed.  She is obese. She is not ill-appearing.  HENT:     Head: Normocephalic and atraumatic.  Eyes:     Conjunctiva/sclera: Conjunctivae normal.     Pupils: Pupils are equal, round, and reactive to light.  Neck:     Thyroid: No thyromegaly.     Vascular: No carotid bruit or JVD.  Cardiovascular:     Rate and Rhythm: Normal rate and regular rhythm.     Heart sounds: Normal heart sounds. No gallop.   Pulmonary:     Effort: Pulmonary effort is normal. No respiratory distress.     Breath sounds: Normal breath sounds. No wheezing or rales.  Abdominal:     General: Bowel sounds are normal. There is no distension or abdominal bruit.     Palpations:  Abdomen is soft. There is no mass.     Tenderness: There is no abdominal tenderness.  Musculoskeletal:     Cervical back: Normal range of motion and neck supple.  Lymphadenopathy:     Cervical: No cervical adenopathy.  Skin:    General: Skin is warm and dry.     Findings: No rash.  Neurological:     Mental Status: She is alert.     Sensory: No sensory deficit.     Coordination: Coordination normal.     Deep Tendon Reflexes: Reflexes are normal and symmetric. Reflexes normal.  Psychiatric:        Attention and Perception: Attention normal.        Mood and Affect: Mood is anxious and depressed.        Behavior: Behavior normal.        Thought Content: Thought content normal.        Cognition and Memory: Cognition and memory normal.     Comments: Almost tearful when discussing grief and loss Discusses stressors candidly           Assessment & Plan:   Problem List Items Addressed This Visit      Endocrine   Hypothyroidism    Recent elevated TSH at gyn office with appropriate compliance Will inc levothyroxine to 200 mcg daily and re check labs in 2 mo      Relevant Medications   levothyroxine (SYNTHROID) 200 MCG tablet     Other   Hyperlipidemia    Due for lipid check on atorvastatin 10 mg daily  Diet is not optimal Disc goals  for lipids and reasons to control them Rev last labs with pt Rev low sat fat diet in detail       OBESITY, MORBID    Discussed how this problem influences overall health and the risks it imposes  Reviewed plan for weight loss with lower calorie diet (via better food choices and also portion control or program like weight watchers) and exercise building up to or more than 30 minutes 5 days per week including some aerobic activity   Needs to work her ways through grief/address emotional eating- counseling ref done  Disc healthier diet options       Vitamin D deficiency    D level was 21.9 at gyn office Recommend extra 2000 iu of vit D daily  Disc imp to bone and overall health        Elevated liver transaminase level    Strongly suspect fatty liver (runs in family) in setting of mobid obesity  Motivation to work on diet and wt  Re check 2 mo        Grief reaction - Primary    Lost multiple family members including mother (to covid) She did not get to grieve due to being sick herself  Counseling ref done  If interested later may discuss medication  Reviewed stressors/ coping techniques/symptoms/ support sources/ tx options and side effects in detail today       Relevant Orders   Ambulatory referral to Psychology

## 2021-03-25 NOTE — Patient Instructions (Addendum)
I recommend a  modera or pfizer booster when you are able   Continue the multi vitamin  Do not take extra calcium due to GI side effects  Add 2000 iu of vitamin D3 daily   Weight loss and lower fat diet may help the liver tests  We need to watch that   Go up to levothyroxine to 200 mcg daily   Let's check labs in 2 months then follow   I placed a counseling referral  You will get a call

## 2021-03-27 NOTE — Assessment & Plan Note (Signed)
Recent elevated TSH at gyn office with appropriate compliance Will inc levothyroxine to 200 mcg daily and re check labs in 2 mo

## 2021-03-27 NOTE — Assessment & Plan Note (Signed)
Discussed how this problem influences overall health and the risks it imposes  Reviewed plan for weight loss with lower calorie diet (via better food choices and also portion control or program like weight watchers) and exercise building up to or more than 30 minutes 5 days per week including some aerobic activity   Needs to work her ways through grief/address emotional eating- counseling ref done  Disc healthier diet options

## 2021-03-27 NOTE — Assessment & Plan Note (Signed)
Lost multiple family members including mother (to covid) She did not get to grieve due to being sick herself  Counseling ref done  If interested later may discuss medication  Reviewed stressors/ coping techniques/symptoms/ support sources/ tx options and side effects in detail today

## 2021-03-27 NOTE — Assessment & Plan Note (Signed)
Strongly suspect fatty liver (runs in family) in setting of mobid obesity  Motivation to work on diet and wt  Re check 2 mo

## 2021-03-27 NOTE — Assessment & Plan Note (Signed)
Due for lipid check on atorvastatin 10 mg daily  Diet is not optimal Disc goals for lipids and reasons to control them Rev last labs with pt Rev low sat fat diet in detail

## 2021-03-27 NOTE — Assessment & Plan Note (Signed)
D level was 21.9 at gyn office Recommend extra 2000 iu of vit D daily  Disc imp to bone and overall health

## 2021-04-14 ENCOUNTER — Other Ambulatory Visit: Payer: Self-pay | Admitting: Family Medicine

## 2021-04-23 ENCOUNTER — Ambulatory Visit: Admit: 2021-04-23 | Discharge status: Absent without leave | Payer: Self-pay | Source: Home / Self Care

## 2021-04-23 ENCOUNTER — Other Ambulatory Visit: Payer: Self-pay

## 2021-04-23 ENCOUNTER — Ambulatory Visit
Admission: EM | Admit: 2021-04-23 | Discharge: 2021-04-23 | Disposition: A | Discharge status: Home / Self Care | Payer: No Typology Code available for payment source | Attending: Family Medicine | Admitting: Family Medicine

## 2021-04-23 ENCOUNTER — Ambulatory Visit (INDEPENDENT_AMBULATORY_CARE_PROVIDER_SITE_OTHER): Payer: No Typology Code available for payment source

## 2021-04-23 ENCOUNTER — Encounter: Payer: Self-pay | Admitting: Emergency Medicine

## 2021-04-23 DIAGNOSIS — J069 Acute upper respiratory infection, unspecified: Secondary | ICD-10-CM

## 2021-04-23 DIAGNOSIS — R059 Cough, unspecified: Secondary | ICD-10-CM

## 2021-04-23 DIAGNOSIS — Z20822 Contact with and (suspected) exposure to covid-19: Secondary | ICD-10-CM | POA: Insufficient documentation

## 2021-04-23 DIAGNOSIS — R062 Wheezing: Secondary | ICD-10-CM | POA: Diagnosis not present

## 2021-04-23 LAB — GROUP A STREP BY PCR: Group A Strep by PCR: NOT DETECTED

## 2021-04-23 MED ORDER — PREDNISONE 50 MG PO TABS: ORAL_TABLET | ORAL | 0 refills | Status: DC

## 2021-04-23 MED ORDER — PROMETHAZINE-DM 6.25-15 MG/5ML PO SYRP: 5.0000 mL | ORAL_SOLUTION | Freq: Four times a day (QID) | ORAL | 0 refills | Status: DC | PRN

## 2021-04-23 NOTE — ED Provider Notes (Signed)
[facility]    CSN: [id number] Arrival date & time: 04/23/21  0936      History   Chief Complaint Chief Complaint  Patient presents with  . Cough  . Nasal Congestion   HPI  45 year old female presents with the above complaints.  Patient reports that she was not feeling well last week.  Got worse over this past week.  She states that she has had sinus congestion, sore throat, cough.  She reports that she is wheezing.  Chills but no fever.  She is predominantly bothered by the cough.  She was recently prescribed an albuterol inhaler and states it has not improved her symptoms.  No other reported symptoms.  No other complaints at this time.  Past Medical History:  Diagnosis Date  . Bronchitis    just finished abx  11/01/14, has inhaler for bronchitis tx  . Edema   . Morbid obesity (HCC)   . Other and unspecified hyperlipidemia    no meds  . Unspecified hypothyroidism     Patient Active Problem List   Diagnosis Date Noted  . Vitamin D deficiency 03/25/2021  . Elevated liver transaminase level 03/25/2021  . Grief reaction 03/25/2021  . Endometriosis 12/12/2017  . Bronchospasm 12/12/2017  . S/P laparotomy 11/17/2014  . Amenorrhea 03/17/2011  . Routine general medical examination at a health care facility 03/16/2011  . Hyperlipidemia 01/03/2010  . Hypothyroidism 10/07/2007  . OBESITY, MORBID 09/30/2007  . LEG EDEMA 09/30/2007    Past Surgical History:  Procedure Laterality Date  . EYE SURGERY     lazy eye as a child  . LAPAROTOMY N/A 11/17/2014   Procedure: EXPLORATORY LAPAROTOMY, chromopertubation;  Surgeon: Turner Daniels, MD;  Location: [facility];  Service: Gynecology;  Laterality: N/A;  . LYSIS OF ADHESION N/A 11/17/2014   Procedure: LYSIS OF ADHESION;  Surgeon: Turner Daniels, MD;  Location: [facility];  Service: Gynecology;  Laterality: N/A;  . OVARIAN CYST REMOVAL Right 11/17/2014   Procedure: OVARIAN CYSTECTOMY;  Surgeon: Turner Daniels, MD;  Location: [facility];   Service: Gynecology;  Laterality: Right;  . WISDOM TOOTH EXTRACTION      OB History   No obstetric history on file.      Home Medications    Prior to Admission medications   Medication Sig Start Date End Date Taking? Authorizing Provider  atorvastatin (LIPITOR) 10 MG tablet TAKE ONE TABLET BY MOUTH DAILY 04/14/21  Yes Tower, Audrie Gallus, MD  Elagolix Sodium (ORILISSA) 150 MG TABS Take 1 tablet by mouth daily.   Yes [provider]  levothyroxine (SYNTHROID) 200 MCG tablet Take 1 tablet (200 mcg total) by mouth daily. 03/25/21  Yes Tower, Audrie Gallus, MD  predniSONE (DELTASONE) 50 MG tablet 1 tablet daily x 5 days 04/23/21  Yes Khushboo Chuck G, DO  promethazine-dextromethorphan (PROMETHAZINE-DM) 6.25-15 MG/5ML syrup Take 5 mLs by mouth 4 (four) times daily as needed for cough. 04/23/21  Yes Telitha Plath G, DO  WIXELA INHUB 100-50 MCG/DOSE AEPB INHALE 1 PUFF BY MOUTH TWICE DAILY(RINSE MOUTH AFTER EACH USE) 10/26/20   Tower, Audrie Gallus, MD    Family History Family History  Problem Relation Age of Onset  . Hypertension Maternal Grandmother   . Cancer Maternal Grandfather   . COPD Maternal Grandfather   . Stroke Paternal Grandfather   . COPD Paternal Grandfather     Social History Social History   Tobacco Use  . Smoking status: Former Smoker    Packs/day: 0.25  Years: 4.00    Pack years: 1.00    Types: Cigarettes    Quit date: 12/18/1998    Years since quitting: 22.3  . Smokeless tobacco: Never Used  . Tobacco comment: Quit in college and was not an everyday smoker  Vaping Use  . Vaping Use: Never used  Substance Use Topics  . Alcohol use: Yes    Alcohol/week: 0.0 standard drinks    Comment: socially  . Drug use: No     Allergies   Erythromycin, Penicillins, and Soy allergy   Review of Systems Review of Systems  Constitutional: Positive for fever.  HENT: Positive for congestion and sore throat.   Respiratory: Positive for cough and wheezing.    Physical Exam Triage  Vital Signs ED Triage Vitals  Enc Vitals Group     BP 04/23/21 0948 130/73     Pulse Rate 04/23/21 0948 85     Resp 04/23/21 0948 16     Temp 04/23/21 0948 98 F (36.7 C)     Temp Source 04/23/21 0948 Oral     SpO2 04/23/21 0948 97 %     Weight 04/23/21 0944 (!) 340 lb (154.2 kg)     Height 04/23/21 0944 5\' 6"  (1.676 m)     Head Circumference --      Peak Flow --      Pain Score 04/23/21 1040 5     Pain Loc --      Pain Edu? --      Excl. in GC? --    Updated Vital Signs BP 130/73 (BP Location: Left Arm)   Pulse 85   Temp 98 F (36.7 C) (Oral)   Resp 16   Ht 5\' 6"  (1.676 m)   Wt (!) 154.2 kg   SpO2 97%   BMI 54.88 kg/m   Visual Acuity Right Eye Distance:   Left Eye Distance:   Bilateral Distance:    Right Eye Near:   Left Eye Near:    Bilateral Near:     Physical Exam Constitutional:      General: She is not in acute distress.    Appearance: She is obese. She is not ill-appearing.  HENT:     Head: Normocephalic and atraumatic.  Eyes:     General:        Right eye: No discharge.        Left eye: No discharge.     Conjunctiva/sclera: Conjunctivae normal.  Cardiovascular:     Rate and Rhythm: Normal rate and regular rhythm.     Heart sounds: No murmur heard.   Pulmonary:     Effort: Pulmonary effort is normal.     Breath sounds: Wheezing present.  Neurological:     Mental Status: She is alert.    UC Treatments / Results  Labs (all labs ordered are listed, but only abnormal results are displayed) Labs Reviewed  GROUP A STREP BY PCR  SARS CORONAVIRUS 2 (TAT 6-24 HRS)    EKG   Radiology DG Chest 2 View  Result Date: 04/23/2021 CLINICAL DATA:  Cough EXAM: CHEST - 2 VIEW COMPARISON:  12/02/2014 chest radiograph. FINDINGS: Stable cardiomediastinal silhouette with normal heart size. No pneumothorax. No pleural effusion. Lungs appear clear, with no acute consolidative airspace disease and no pulmonary edema. IMPRESSION: No active cardiopulmonary  disease. Electronically Signed   By: 06/23/2021 M.D.   On: 04/23/2021 10:19    Procedures Procedures (including critical care time)  Medications Ordered in UC Medications -  No data to display  Initial Impression / Assessment and Plan / UC Course  I have reviewed the triage vital signs and the nursing notes.  Pertinent labs & imaging results that were available during my care of the patient were reviewed by me and considered in my medical decision making (see chart for details).    45 year old female presents with viral URI with cough and associated wheezing.  Chest x-ray was obtained and was independent reviewed by me.  Interpretation: Normal chest x-ray.  No evidence of pneumonia.  Strep negative.  Placing on prednisone.  Promethazine DM for cough.  Final Clinical Impressions(s) / UC Diagnoses   Final diagnoses:  Viral URI with cough  Wheezing     Discharge Instructions     Rest.  Continue using albuterol as needed.  Medication as directed.  Take care  Dr. Adriana Simas    ED Prescriptions    Medication Sig Dispense Auth. Provider   predniSONE (DELTASONE) 50 MG tablet 1 tablet daily x 5 days 5 tablet Srihari Shellhammer G, DO   promethazine-dextromethorphan (PROMETHAZINE-DM) 6.25-15 MG/5ML syrup Take 5 mLs by mouth 4 (four) times daily as needed for cough. 118 mL Tommie Sams, DO     PDMP not reviewed this encounter.   Tommie Sams, DO 04/23/21 1104

## 2021-04-23 NOTE — ED Triage Notes (Signed)
Patient c/o sinus congestion, cough, chest congestion that started on Tuesday.  Patient denies fevers.  Patient reports chills.

## 2021-04-23 NOTE — Discharge Instructions (Signed)
Rest.  Continue using albuterol as needed.  Medication as directed.  Take care  Dr. Adriana Simas

## 2021-04-24 LAB — SARS CORONAVIRUS 2 (TAT 6-24 HRS): SARS Coronavirus 2: NEGATIVE

## 2021-04-25 ENCOUNTER — Encounter: Payer: Self-pay | Admitting: Family Medicine

## 2021-05-15 ENCOUNTER — Other Ambulatory Visit: Payer: Self-pay | Admitting: Family Medicine

## 2021-05-18 ENCOUNTER — Ambulatory Visit: Payer: No Typology Code available for payment source | Admitting: Psychology

## 2021-05-25 ENCOUNTER — Other Ambulatory Visit (INDEPENDENT_AMBULATORY_CARE_PROVIDER_SITE_OTHER): Payer: No Typology Code available for payment source

## 2021-05-25 ENCOUNTER — Other Ambulatory Visit: Payer: Self-pay

## 2021-05-25 DIAGNOSIS — E039 Hypothyroidism, unspecified: Secondary | ICD-10-CM | POA: Diagnosis not present

## 2021-05-25 DIAGNOSIS — E7800 Pure hypercholesterolemia, unspecified: Secondary | ICD-10-CM | POA: Diagnosis not present

## 2021-05-25 DIAGNOSIS — E559 Vitamin D deficiency, unspecified: Secondary | ICD-10-CM

## 2021-05-25 LAB — LIPID PANEL
Cholesterol: 150 mg/dL (ref 0–200)
HDL: 51.1 mg/dL (ref >39.00)
LDL Cholesterol: 70 mg/dL (ref 0–99)
NonHDL: 98.42
Total CHOL/HDL Ratio: 3
Triglycerides: 142 mg/dL (ref 0.0–149.0)
VLDL: 28.4 mg/dL (ref 0.0–40.0)

## 2021-05-25 LAB — VITAMIN D 25 HYDROXY (VIT D DEFICIENCY, FRACTURES): VITD: 22.35 ng/mL — ABNORMAL LOW (ref 30.00–100.00)

## 2021-05-25 LAB — TSH: TSH: 0.96 u[IU]/mL (ref 0.35–4.50)

## 2021-06-01 ENCOUNTER — Encounter: Payer: Self-pay | Admitting: Family Medicine

## 2021-06-01 ENCOUNTER — Ambulatory Visit (INDEPENDENT_AMBULATORY_CARE_PROVIDER_SITE_OTHER): Payer: No Typology Code available for payment source | Admitting: Family Medicine

## 2021-06-01 ENCOUNTER — Other Ambulatory Visit: Payer: Self-pay

## 2021-06-01 VITALS — BP 136/88 | HR 93 | Temp 96.9°F | Ht 67.0 in | Wt 389.6 lb

## 2021-06-01 DIAGNOSIS — E7800 Pure hypercholesterolemia, unspecified: Secondary | ICD-10-CM

## 2021-06-01 DIAGNOSIS — E039 Hypothyroidism, unspecified: Secondary | ICD-10-CM

## 2021-06-01 DIAGNOSIS — E559 Vitamin D deficiency, unspecified: Secondary | ICD-10-CM | POA: Diagnosis not present

## 2021-06-01 DIAGNOSIS — N809 Endometriosis, unspecified: Secondary | ICD-10-CM

## 2021-06-01 DIAGNOSIS — R600 Localized edema: Secondary | ICD-10-CM

## 2021-06-01 DIAGNOSIS — R7401 Elevation of levels of liver transaminase levels: Secondary | ICD-10-CM

## 2021-06-01 DIAGNOSIS — F4321 Adjustment disorder with depressed mood: Secondary | ICD-10-CM

## 2021-06-01 LAB — HEPATIC FUNCTION PANEL
ALT: 55 U/L — ABNORMAL HIGH (ref 0–35)
AST: 26 U/L (ref 0–37)
Albumin: 4 g/dL (ref 3.5–5.2)
Alkaline Phosphatase: 73 U/L (ref 39–117)
Bilirubin, Direct: 0.1 mg/dL (ref 0.0–0.3)
Total Bilirubin: 0.4 mg/dL (ref 0.2–1.2)
Total Protein: 6.7 g/dL (ref 6.0–8.3)

## 2021-06-01 LAB — BASIC METABOLIC PANEL
BUN: 15 mg/dL (ref 6–23)
CO2: 26 mEq/L (ref 19–32)
Calcium: 9.4 mg/dL (ref 8.4–10.5)
Chloride: 105 mEq/L (ref 96–112)
Creatinine, Ser: 0.9 mg/dL (ref 0.40–1.20)
GFR: 78.25 mL/min (ref >60.00)
Glucose, Bld: 97 mg/dL (ref 70–99)
Potassium: 3.9 mEq/L (ref 3.5–5.1)
Sodium: 139 mEq/L (ref 135–145)

## 2021-06-01 NOTE — Assessment & Plan Note (Signed)
Doing better, under care of gyn  On orlissa but plans to stop it in prep for plan to try for pregnancy

## 2021-06-01 NOTE — Assessment & Plan Note (Signed)
Doing better overall  Good self care  Pt cancelled counseling appt

## 2021-06-01 NOTE — Progress Notes (Signed)
Subjective:    Patient ID: Nancy Dunlap, female    DOB: [birth year], 45 y.o.   MRN: [id number]  This visit occurred during the SARS-CoV-2 public health emergency.  Safety protocols were in place, including screening questions prior to the visit, additional usage of staff PPE, and extensive cleaning of exam room while observing appropriate contact time as indicated for disinfecting solutions.   HPI Pt presents for f/u of hypothyroidism and fatty liver and grief reaction and low vit D  Wt Readings from Last 3 Encounters:  06/01/21 (!) 389 lb 9 oz (176.7 kg)  04/23/21 (!) 340 lb (154.2 kg)  03/25/21 (!) 383 lb 2 oz (173.8 kg)   61.01 kg/m Making good progress with wt loss    Doing better with her endometriosis  Wants to go ahead and try and get pregnant (will hold orlissa) -just using no contraception  Last visit noted tsh was elevated at gyn office We inc her levothyroxine to 200 mcg daily  Lab Results  Component Value Date   TSH 0.96 05/25/2021   Improved/in nl range  Feels pretty good   Feet are more swollen lately  Tries to avoid sodium  Eating less processed food   Lab Results  Component Value Date   CREATININE 0.95 02/11/2020   BUN 15 02/11/2020   NA 140 02/11/2020   K 4.4 02/11/2020   CL 108 02/11/2020   CO2 24 02/11/2020     Trying to exercise more  Has been to the gym a few times  Would like to walk outdoors when it is not able   BP Readings from Last 3 Encounters:  06/01/21 136/88  04/23/21 130/73  03/25/21 135/88   Pulse Readings from Last 3 Encounters:  06/01/21 93  04/23/21 85  03/25/21 92    D level is 22.3  Taking 2000 iu daily   Lab Results  Component Value Date   CHOL 150 05/25/2021   CHOL 127 02/11/2020   CHOL 126 01/06/2019   Lab Results  Component Value Date   HDL 51.10 05/25/2021   HDL 30.50 (L) 02/11/2020   HDL 29.00 (L) 01/06/2019   Lab Results  Component Value Date   LDLCALC 70 05/25/2021   LDLCALC 73 02/11/2020    LDLCALC 71 01/06/2019   Lab Results  Component Value Date   TRIG 142.0 05/25/2021   TRIG 116.0 02/11/2020   TRIG 128.0 01/06/2019   Lab Results  Component Value Date   CHOLHDL 3 05/25/2021   CHOLHDL 4 02/11/2020   CHOLHDL 4 01/06/2019   Lab Results  Component Value Date   LDLDIRECT 126.4 04/10/2012   LDLDIRECT 135.3 10/28/2010   LDLDIRECT 139.3 09/30/2007    Patient Active Problem List   Diagnosis Date Noted   Pedal edema 06/01/2021   Vitamin D deficiency 03/25/2021   Elevated liver transaminase level 03/25/2021   Grief reaction 03/25/2021   Endometriosis 12/12/2017   Bronchospasm 12/12/2017   S/P laparotomy 11/17/2014   Amenorrhea 03/17/2011   Routine general medical examination at a health care facility 03/16/2011   Hyperlipidemia 01/03/2010   Hypothyroidism 10/07/2007   OBESITY, MORBID 09/30/2007   LEG EDEMA 09/30/2007   Past Medical History:  Diagnosis Date   Bronchitis    just finished abx  11/01/14, has inhaler for bronchitis tx   Edema    Morbid obesity (HCC)    Other and unspecified hyperlipidemia    no meds   Unspecified hypothyroidism    Past Surgical History:  Procedure  Laterality Date   EYE SURGERY     lazy eye as a child   LAPAROTOMY N/A 11/17/2014   Procedure: EXPLORATORY LAPAROTOMY, chromopertubation;  Surgeon: Turner Daniels, MD;  Location: [facility];  Service: Gynecology;  Laterality: N/A;   LYSIS OF ADHESION N/A 11/17/2014   Procedure: LYSIS OF ADHESION;  Surgeon: Turner Daniels, MD;  Location: [facility];  Service: Gynecology;  Laterality: N/A;   OVARIAN CYST REMOVAL Right 11/17/2014   Procedure: OVARIAN CYSTECTOMY;  Surgeon: Turner Daniels, MD;  Location: [facility];  Service: Gynecology;  Laterality: Right;   WISDOM TOOTH EXTRACTION     Social History   Tobacco Use   Smoking status: Former    Packs/day: 0.25    Years: 4.00    Pack years: 1.00    Types: Cigarettes    Quit date: 12/18/1998    Years since quitting: 22.4   Smokeless tobacco: Never    Tobacco comments:    Quit in college and was not an everyday smoker  Vaping Use   Vaping Use: Never used  Substance Use Topics   Alcohol use: Yes    Alcohol/week: 0.0 standard drinks    Comment: socially   Drug use: No   Family History  Problem Relation Age of Onset   Hypertension Maternal Grandmother    Cancer Maternal Grandfather    COPD Maternal Grandfather    Stroke Paternal Grandfather    COPD Paternal Grandfather    Allergies  Allergen Reactions   Erythromycin Nausea And Vomiting    REACTION: n \\T \ v   Penicillins     REACTION: mom has rxn Patient has never taken PCN Her mom can take cephalosporins without problems   Soy Allergy Hives   Current Outpatient Medications on File Prior to Visit  Medication Sig Dispense Refill   atorvastatin (LIPITOR) 10 MG tablet TAKE ONE TABLET BY MOUTH DAILY 90 tablet 1   Elagolix Sodium (ORILISSA) 150 MG TABS Take 1 tablet by mouth daily.     levothyroxine (SYNTHROID) 200 MCG tablet Take 1 tablet (200 mcg total) by mouth daily. 30 tablet 3   WIXELA INHUB 100-50 MCG/ACT AEPB INHALE 1 PUFF BY MOUTH TWICE DAILY(RINSE MOUTH AFTER EACH USE) 60 each 2   No current facility-administered medications on file prior to visit.    Review of Systems  Constitutional:  Negative for activity change, appetite change, fatigue, fever and unexpected weight change.  HENT:  Negative for congestion, ear pain, rhinorrhea, sinus pressure and sore throat.   Eyes:  Negative for pain, redness and visual disturbance.  Respiratory:  Negative for cough, shortness of breath and wheezing.   Cardiovascular:  Positive for leg swelling. Negative for chest pain and palpitations.  Gastrointestinal:  Negative for abdominal pain, blood in stool, constipation and diarrhea.  Endocrine: Negative for polydipsia and polyuria.  Genitourinary:  Negative for dysuria, frequency and urgency.  Musculoskeletal:  Negative for arthralgias, back pain and myalgias.  Skin:  Negative for  pallor and rash.  Allergic/Immunologic: Negative for environmental allergies.  Neurological:  Negative for dizziness, syncope and headaches.  Hematological:  Negative for adenopathy. Does not bruise/bleed easily.  Psychiatric/Behavioral:  Negative for decreased concentration and dysphoric mood. The patient is not nervous/anxious.       Objective:   Physical Exam Constitutional:      General: She is not in acute distress.    Appearance: Normal appearance. She is well-developed.  HENT:     Head: Normocephalic and atraumatic.  Eyes:  Conjunctiva/sclera: Conjunctivae normal.     Pupils: Pupils are equal, round, and reactive to light.  Neck:     Thyroid: No thyromegaly.     Vascular: No carotid bruit or JVD.  Cardiovascular:     Rate and Rhythm: Normal rate and regular rhythm.     Heart sounds: Normal heart sounds.    No gallop.  Pulmonary:     Effort: Pulmonary effort is normal. No respiratory distress.     Breath sounds: Normal breath sounds. No wheezing or rales.  Abdominal:     General: Bowel sounds are normal. There is no distension or abdominal bruit.     Palpations: Abdomen is soft. There is no mass.     Tenderness: There is no abdominal tenderness.  Musculoskeletal:     Cervical back: Normal range of motion and neck supple.     Right lower leg: No edema.     Left lower leg: No edema.  Lymphadenopathy:     Cervical: No cervical adenopathy.  Skin:    General: Skin is warm and dry.     Coloration: Skin is not pale.     Findings: No rash.  Neurological:     Mental Status: She is alert.     Coordination: Coordination normal.     Deep Tendon Reflexes: Reflexes are normal and symmetric. Reflexes normal.  Psychiatric:        Mood and Affect: Mood normal.        Cognition and Memory: Cognition and memory normal.     Comments: Not tearful  Able to discuss grief           Assessment & Plan:   Problem List Items Addressed This Visit       Endocrine    Hypothyroidism - Primary    Lab Results  Component Value Date   TSH 0.96 05/25/2021  Therapeutic with 200 mcg levothyroxine daily  No clinical changes           Other   Hyperlipidemia    Disc goals for lipids and reasons to control them Rev last labs with pt Rev low sat fat diet in detail Good control with atorvastatin 10 mg daily  Will have to hold if she tries to get pregnant       OBESITY, MORBID    Discussed how this problem influences overall health and the risks it imposes  Reviewed plan for weight loss with lower calorie diet (via better food choices and also portion control or program like weight watchers) and exercise building up to or more than 30 minutes 5 days per week including some aerobic activity   Commended effort and wt loss so far         Endometriosis    Doing better, under care of gyn  On orlissa but plans to stop it in prep for plan to try for pregnancy       Vitamin D deficiency    Level of 22.3 on 2000 iu D3 daily  inst to inc to 4000 iu daily Disc imp for bone and overall health       Elevated liver transaminase level    Working on wt loss  On atorvastatin -will have to hold if she tries to get pregnant  Also taking orlissa (stopping today also) Low fat diet  Lab today  Notes-also more pedal edema lately       Relevant Orders   Hepatic function panel (Completed)   Grief reaction    Doing  better overall  Good self care  Pt cancelled counseling appt       Pedal edema    Worse lately  ? If hot weather worsens Given handout re: dash eating  supp stockings may help  Wt loss may help  Enc to elevate feet when able Renal panel and lft today       Relevant Orders   Basic metabolic panel (Completed)   Hepatic function panel (Completed)

## 2021-06-01 NOTE — Assessment & Plan Note (Signed)
Working on wt loss  On atorvastatin -will have to hold if she tries to get pregnant  Also taking orlissa (stopping today also) Low fat diet  Lab today  Notes-also more pedal edema lately

## 2021-06-01 NOTE — Assessment & Plan Note (Signed)
Level of 22.3 on 2000 iu D3 daily  inst to inc to 4000 iu daily Disc imp for bone and overall health

## 2021-06-01 NOTE — Assessment & Plan Note (Signed)
Discussed how this problem influences overall health and the risks it imposes  Reviewed plan for weight loss with lower calorie diet (via better food choices and also portion control or program like weight watchers) and exercise building up to or more than 30 minutes 5 days per week including some aerobic activity   Commended effort and wt loss so far

## 2021-06-01 NOTE — Assessment & Plan Note (Addendum)
Worse lately  ? If hot weather worsens Given handout re: dash eating  supp stockings may help  Wt loss may help  Enc to elevate feet when able Renal panel and lft today

## 2021-06-01 NOTE — Assessment & Plan Note (Signed)
Disc goals for lipids and reasons to control them Rev last labs with pt Rev low sat fat diet in detail Good control with atorvastatin 10 mg daily  Will have to hold if she tries to get pregnant

## 2021-06-01 NOTE — Patient Instructions (Addendum)
Keep working on [employer] and weight loss   Avoid excess sodium Elevate feet when you sit  You can wear support stockings if helpful (during the day)  Labs today for kidney and liver function   If you get short of breath let us know   Increase your vitamin D to 4000 iu daily    Hold the atorvastatin while trying to get pregnant Start that when done

## 2021-06-01 NOTE — Assessment & Plan Note (Signed)
Lab Results  Component Value Date   TSH 0.96 05/25/2021   Therapeutic with 200 mcg levothyroxine daily  No clinical changes

## 2021-07-25 ENCOUNTER — Other Ambulatory Visit: Payer: Self-pay | Admitting: *Deleted

## 2021-07-25 MED ORDER — LEVOTHYROXINE SODIUM 200 MCG PO TABS
200.0000 ug | ORAL_TABLET | Freq: Every day | ORAL | 1 refills | Status: DC
Start: 2021-07-25 — End: 2022-01-18

## 2021-11-29 ENCOUNTER — Encounter: Payer: Self-pay | Admitting: Family Medicine

## 2021-11-30 ENCOUNTER — Telehealth: Payer: No Typology Code available for payment source | Admitting: Physician Assistant

## 2021-11-30 DIAGNOSIS — U071 COVID-19: Secondary | ICD-10-CM | POA: Diagnosis not present

## 2021-11-30 MED ORDER — MOLNUPIRAVIR EUA 200MG CAPSULE: 4.0000 | ORAL_CAPSULE | Freq: Two times a day (BID) | ORAL | 0 refills | Status: AC

## 2021-11-30 NOTE — Progress Notes (Signed)
Virtual Visit Consent   Nancy Dunlap, you are scheduled for a virtual visit with a [facility] provider today.     Just as with appointments in the office, your consent must be obtained to participate.  Your consent will be active for this visit and any virtual visit you may have with one of our providers in the next 365 days.     If you have a MyChart account, a copy of this consent can be sent to you electronically.  All virtual visits are billed to your insurance company just like a traditional visit in the office.    As this is a virtual visit, video technology does not allow for your provider to perform a traditional examination.  This may limit your provider's ability to fully assess your condition.  If your provider identifies any concerns that need to be evaluated in person or the need to arrange testing (such as labs, EKG, etc.), we will make arrangements to do so.     Although advances in technology are sophisticated, we cannot ensure that it will always work on either your end or our end.  If the connection with a video visit is poor, the visit may have to be switched to a telephone visit.  With either a video or telephone visit, we are not always able to ensure that we have a secure connection.     I need to obtain your verbal consent now.   Are you willing to proceed with your visit today?    Nancy Dunlap has provided verbal consent on 11/30/2021 for a virtual visit (video or telephone).   Piedad Climes, [place]   Date: 11/30/2021 3:39 PM   Virtual Visit via Video Note   I, Piedad Climes, connected with  Nancy Dunlap  ([id number], January 01, 1976) on 11/30/21 at  3:30 PM EST by a video-enabled telemedicine application and verified that I am speaking with the correct person using two identifiers.  Location: Patient: Virtual Visit Location Patient: Home Provider: Virtual Visit Location Provider: Home Office   I discussed the limitations of evaluation and  management by telemedicine and the availability of in person appointments. The patient expressed understanding and agreed to proceed.    History of Present Illness: Nancy Dunlap is a 45 y.o. who identifies as a female who was assigned female at birth, and is being seen today for COVID-19. Endorses symptoms starting Monday night into Tuesday morning with cold symptoms. The next day symptoms began to worsen. Notes nasal/head congestion, cough, chills, fatigue. Denies fever, shortness of breath She also notes anorexia but denies other GI symptoms. Took a home test Tuesday which was positive. Notes she had COVID last year and this go round is much milder. Has been taking OTC Mucinex, Vitamin C and [place].   HPI: HPI  Problems:  Patient Active Problem List   Diagnosis Date Noted   Pedal edema 06/01/2021   Vitamin D deficiency 03/25/2021   Elevated liver transaminase level 03/25/2021   Grief reaction 03/25/2021   Endometriosis 12/12/2017   Bronchospasm 12/12/2017   S/P laparotomy 11/17/2014   Amenorrhea 03/17/2011   Routine general medical examination at a health care facility 03/16/2011   Hyperlipidemia 01/03/2010   Hypothyroidism 10/07/2007   OBESITY, MORBID 09/30/2007   LEG EDEMA 09/30/2007    Allergies:  Allergies  Allergen Reactions   Erythromycin Nausea And Vomiting    REACTION: n \\T \ v   Penicillins     REACTION: mom has rxn  Patient has never taken PCN Her mom can take cephalosporins without problems   Soy Allergy Hives   Medications:  Current Outpatient Medications:    molnupiravir EUA (LAGEVRIO) 200 mg CAPS capsule, Take 4 capsules (800 mg total) by mouth 2 (two) times daily for 5 days., Disp: 40 capsule, Rfl: 0   atorvastatin (LIPITOR) 10 MG tablet, TAKE ONE TABLET BY MOUTH DAILY, Disp: 90 tablet, Rfl: 1   Elagolix Sodium (ORILISSA) 150 MG TABS, Take 1 tablet by mouth daily., Disp: , Rfl:    levothyroxine (SYNTHROID) 200 MCG tablet, Take 1 tablet (200 mcg total) by  mouth daily before breakfast., Disp: 90 tablet, Rfl: 1   WIXELA INHUB 100-50 MCG/ACT AEPB, INHALE 1 PUFF BY MOUTH TWICE DAILY(RINSE MOUTH AFTER EACH USE), Disp: 60 each, Rfl: 2  Observations/Objective: Patient is well-developed, well-nourished in no acute distress.  Resting comfortably at home.  Head is normocephalic, atraumatic.  No labored breathing. Speech is clear and coherent with logical content.  Patient is alert and oriented at baseline.   Assessment and Plan: 1. COVID-19 - molnupiravir EUA (LAGEVRIO) 200 mg CAPS capsule; Take 4 capsules (800 mg total) by mouth 2 (two) times daily for 5 days.  Dispense: 40 capsule; Refill: 0 Positive test. She is within time range for antiviral. Discussed risks/benefits of antiviral medications including most common potential ADRs. Patient voiced understanding and would like to proceed with antiviral medication. They are candidate for molnupiravir. Rx sent to pharmacy. Supportive measures, OTC medications and vitamin regimen reviewed. Patient has been enrolled in a MyChart COVID symptom monitoring program. Anne Shutter reviewed in detail. Strict ER precautions discussed with patient.    Follow Up Instructions: I discussed the assessment and treatment plan with the patient. The patient was provided an opportunity to ask questions and all were answered. The patient agreed with the plan and demonstrated an understanding of the instructions.  A copy of instructions were sent to the patient via MyChart unless otherwise noted below.   The patient was advised to call back or seek an in-person evaluation if the symptoms worsen or if the condition fails to improve as anticipated.  Time:  I spent 12 minutes with the patient via telehealth technology discussing the above problems/concerns.    Piedad Climes, PA-C

## 2021-11-30 NOTE — Patient Instructions (Signed)
Thomos Lemons, thank you for joining Piedad Climes, PA-C for today's virtual visit.  While this provider is not your primary care provider (PCP), if your PCP is located in our provider database this encounter information will be shared with them immediately following your visit.  Consent: (Patient) Nancy Dunlap provided verbal consent for this virtual visit at the beginning of the encounter.  Current Medications:  Current Outpatient Medications:    atorvastatin (LIPITOR) 10 MG tablet, TAKE ONE TABLET BY MOUTH DAILY, Disp: 90 tablet, Rfl: 1   Elagolix Sodium (ORILISSA) 150 MG TABS, Take 1 tablet by mouth daily., Disp: , Rfl:    levothyroxine (SYNTHROID) 200 MCG tablet, Take 1 tablet (200 mcg total) by mouth daily before breakfast., Disp: 90 tablet, Rfl: 1   WIXELA INHUB 100-50 MCG/ACT AEPB, INHALE 1 PUFF BY MOUTH TWICE DAILY(RINSE MOUTH AFTER EACH USE), Disp: 60 each, Rfl: 2   Medications ordered in this encounter:  No orders of the defined types were placed in this encounter.    *If you need refills on other medications prior to your next appointment, please contact your pharmacy*  Follow-Up: Call back or seek an in-person evaluation if the symptoms worsen or if the condition fails to improve as anticipated.  Other Instructions Please keep well-hydrated and get plenty of rest. Start a saline nasal rinse to flush out your nasal passages. You can use plain Mucinex to help thin congestion. If you have a humidifier, running in the bedroom at night. I want you to start OTC vitamin D3 1000 units daily, vitamin C 1000 mg daily, and a zinc supplement. Please take prescribed medications as directed.  You have been enrolled in a MyChart symptom monitoring program. Please answer these questions daily so we can keep track of how you are doing.  You were to quarantine for 5 days from onset of your symptoms.  After day 5, if you have had no fever and you are feeling better, you  can end quarantine but need to mask for an additional 5 days. After day 5 if you have a fever or are having significant symptoms, please quarantine for full 10 days.  If you note any worsening of symptoms, any significant shortness of breath or any chest pain, please seek ER evaluation ASAP.  Please do not delay care!  COVID-19: What to Do if You Are Sick If you test positive and are an older adult or someone who is at high risk of getting very sick from COVID-19, treatment may be available. Contact a healthcare provider right away after a positive test to determine if you are eligible, even if your symptoms are mild right now. You can also visit a Test to Treat location and, if eligible, receive a prescription from a provider. Don't delay: Treatment must be started within the first few days to be effective. If you have a fever, cough, or other symptoms, you might have COVID-19. Most people have mild illness and are able to recover at home. If you are sick: Keep track of your symptoms. If you have an emergency warning sign (including trouble breathing), call 911. Steps to help prevent the spread of COVID-19 if you are sick If you are sick with COVID-19 or think you might have COVID-19, follow the steps below to care for yourself and to help protect other people in your home and community. Stay home except to get medical care Stay home. Most people with COVID-19 have mild illness and can recover at home  without medical care. Do not leave your home, except to get medical care. Do not visit public areas and do not go to places where you are unable to wear a mask. Take care of yourself. Get rest and stay hydrated. Take over-the-counter medicines, such as acetaminophen, to help you feel better. Stay in touch with your doctor. Call before you get medical care. Be sure to get care if you have trouble breathing, or have any other emergency warning signs, or if you think it is an emergency. Avoid public  transportation, ride-sharing, or taxis if possible. Get tested If you have symptoms of COVID-19, get tested. While waiting for test results, stay away from others, including staying apart from those living in your household. Get tested as soon as possible after your symptoms start. Treatments may be available for people with COVID-19 who are at risk for becoming very sick. Don't delay: Treatment must be started early to be effective--some treatments must begin within 5 days of your first symptoms. Contact your healthcare provider right away if your test result is positive to determine if you are eligible. Self-tests are one of several options for testing for the virus that causes COVID-19 and may be more convenient than laboratory-based tests and point-of-care tests. Ask your healthcare provider or your local health department if you need help interpreting your test results. You can visit your state, tribal, local, and territorial health department's website to look for the latest local information on testing sites. Separate yourself from other people As much as possible, stay in a specific room and away from other people and pets in your home. If possible, you should use a separate bathroom. If you need to be around other people or animals in or outside of the home, wear a well-fitting mask. Tell your close contacts that they may have been exposed to COVID-19. An infected person can spread COVID-19 starting 48 hours (or 2 days) before the person has any symptoms or tests positive. By letting your close contacts know they may have been exposed to COVID-19, you are helping to protect everyone. See COVID-19 and Animals if you have questions about pets. If you are diagnosed with COVID-19, someone from the health department may call you. Answer the call to slow the spread. Monitor your symptoms Symptoms of COVID-19 include fever, cough, or other symptoms. Follow care instructions from your healthcare  provider and local health department. Your local health authorities may give instructions on checking your symptoms and reporting information. When to seek emergency medical attention Look for emergency warning signs* for COVID-19. If someone is showing any of these signs, seek emergency medical care immediately: Trouble breathing Persistent pain or pressure in the chest New confusion Inability to wake or stay awake Pale, gray, or blue-colored skin, lips, or nail beds, depending on skin tone *This list is not all possible symptoms. Please call your medical provider for any other symptoms that are severe or concerning to you. Call 911 or call ahead to your local emergency facility: Notify the operator that you are seeking care for someone who has or may have COVID-19. Call ahead before visiting your doctor Call ahead. Many medical visits for routine care are being postponed or done by phone or telemedicine. If you have a medical appointment that cannot be postponed, call your doctor's office, and tell them you have or may have COVID-19. This will help the office protect themselves and other patients. If you are sick, wear a well-fitting mask You should wear a  mask if you must be around other people or animals, including pets (even at home). Wear a mask with the best fit, protection, and comfort for you. You don't need to wear the mask if you are alone. If you can't put on a mask (because of trouble breathing, for example), cover your coughs and sneezes in some other way. Try to stay at least 6 feet away from other people. This will help protect the people around you. Masks should not be placed on young children under age 72 years, anyone who has trouble breathing, or anyone who is not able to remove the mask without help. Cover your coughs and sneezes Cover your mouth and nose with a tissue when you cough or sneeze. Throw away used tissues in a lined trash can. Immediately wash your hands with  soap and water for at least 20 seconds. If soap and water are not available, clean your hands with an alcohol-based hand sanitizer that contains at least 60% alcohol. Clean your hands often Wash your hands often with soap and water for at least 20 seconds. This is especially important after blowing your nose, coughing, or sneezing; going to the bathroom; and before eating or preparing food. Use hand sanitizer if soap and water are not available. Use an alcohol-based hand sanitizer with at least 60% alcohol, covering all surfaces of your hands and rubbing them together until they feel dry. Soap and water are the best option, especially if hands are visibly dirty. Avoid touching your eyes, nose, and mouth with unwashed hands. Handwashing Tips Avoid sharing personal household items Do not share dishes, drinking glasses, cups, eating utensils, towels, or bedding with other people in your home. Wash these items thoroughly after using them with soap and water or put in the dishwasher. Clean surfaces in your home regularly Clean and disinfect high-touch surfaces (for example, doorknobs, tables, handles, light switches, and countertops) in your "sick room" and bathroom. In shared spaces, you should clean and disinfect surfaces and items after each use by the person who is ill. If you are sick and cannot clean, a caregiver or other person should only clean and disinfect the area around you (such as your bedroom and bathroom) on an as needed basis. Your caregiver/other person should wait as long as possible (at least several hours) and wear a mask before entering, cleaning, and disinfecting shared spaces that you use. Clean and disinfect areas that may have blood, stool, or body fluids on them. Use household cleaners and disinfectants. Clean visible dirty surfaces with household cleaners containing soap or detergent. Then, use a household disinfectant. Use a product from [employer] List N: Disinfectants for  Coronavirus (COVID-19). Be sure to follow the instructions on the label to ensure safe and effective use of the product. Many products recommend keeping the surface wet with a disinfectant for a certain period of time (look at "contact time" on the product label). You may also need to wear personal protective equipment, such as gloves, depending on the directions on the product label. Immediately after disinfecting, wash your hands with soap and water for 20 seconds. For completed guidance on cleaning and disinfecting your home, visit Complete Disinfection Guidance. Take steps to improve ventilation at home Improve ventilation (air flow) at home to help prevent from spreading COVID-19 to other people in your household. Clear out COVID-19 virus particles in the air by opening windows, using air filters, and turning on fans in your home. Use this interactive tool to learn how to  improve air flow in your home. When you can be around others after being sick with COVID-19 Deciding when you can be around others is different for different situations. Find out when you can safely end home isolation. For any additional questions about your care, contact your healthcare provider or state or local health department. 03/08/2021 Content source: [facility] ([facility]), Division of Viral Diseases This information is not intended to replace advice given to you by your health care provider. Make sure you discuss any questions you have with your health care provider. Document Revised: 04/21/2021 Document Reviewed: 04/21/2021 Elsevier Patient Education  2022 [employer].      If you have been instructed to have an in-person evaluation today at a local Urgent Care facility, please use the link below. It will take you to a list of all of our available [facility], including address, phone number and hours of operation. Please do not delay care.  [facility]  If you or a family member do not have a primary care provider, use the link below to schedule a visit and establish care. When you choose a [facility] primary care physician or advanced practice provider, you gain a long-term partner in health. Find a Primary Care Provider  Learn more about [facility]'s in-office and virtual care options: [facility] - Get Care Now

## 2021-12-03 ENCOUNTER — Other Ambulatory Visit: Payer: Self-pay | Admitting: Family Medicine

## 2022-01-18 ENCOUNTER — Other Ambulatory Visit: Payer: Self-pay | Admitting: Family Medicine

## 2022-04-14 ENCOUNTER — Other Ambulatory Visit: Payer: Self-pay | Admitting: Family Medicine

## 2022-05-16 ENCOUNTER — Other Ambulatory Visit: Payer: Self-pay | Admitting: Family Medicine

## 2022-06-16 ENCOUNTER — Telehealth: Payer: Self-pay | Admitting: Family Medicine

## 2022-06-16 NOTE — Telephone Encounter (Signed)
Please call patient and schedule CPE.and lab work prior

## 2022-06-16 NOTE — Telephone Encounter (Signed)
Lvm 06/16/22 to schedule appt

## 2022-07-18 ENCOUNTER — Other Ambulatory Visit: Payer: Self-pay | Admitting: Family Medicine

## 2022-07-25 ENCOUNTER — Ambulatory Visit (INDEPENDENT_AMBULATORY_CARE_PROVIDER_SITE_OTHER): Payer: Managed Care, Other (non HMO) | Admitting: Family Medicine

## 2022-07-25 ENCOUNTER — Encounter: Payer: Self-pay | Admitting: Family Medicine

## 2022-07-25 VITALS — BP 126/82 | HR 85 | Ht 67.0 in | Wt 390.0 lb

## 2022-07-25 DIAGNOSIS — E7800 Pure hypercholesterolemia, unspecified: Secondary | ICD-10-CM

## 2022-07-25 DIAGNOSIS — R7401 Elevation of levels of liver transaminase levels: Secondary | ICD-10-CM | POA: Diagnosis not present

## 2022-07-25 DIAGNOSIS — E559 Vitamin D deficiency, unspecified: Secondary | ICD-10-CM

## 2022-07-25 DIAGNOSIS — N809 Endometriosis, unspecified: Secondary | ICD-10-CM

## 2022-07-25 DIAGNOSIS — E039 Hypothyroidism, unspecified: Secondary | ICD-10-CM

## 2022-07-25 DIAGNOSIS — R600 Localized edema: Secondary | ICD-10-CM | POA: Diagnosis not present

## 2022-07-25 DIAGNOSIS — R609 Edema, unspecified: Secondary | ICD-10-CM

## 2022-07-25 LAB — LIPID PANEL
Cholesterol: 181 mg/dL (ref 0–200)
HDL: 50.2 mg/dL (ref >39.00)
LDL Cholesterol: 105 mg/dL — ABNORMAL HIGH (ref 0–99)
NonHDL: 130.87
Total CHOL/HDL Ratio: 4
Triglycerides: 128 mg/dL (ref 0.0–149.0)
VLDL: 25.6 mg/dL (ref 0.0–40.0)

## 2022-07-25 LAB — COMPREHENSIVE METABOLIC PANEL
ALT: 21 U/L (ref 0–35)
AST: 13 U/L (ref 0–37)
Albumin: 4 g/dL (ref 3.5–5.2)
Alkaline Phosphatase: 72 U/L (ref 39–117)
BUN: 16 mg/dL (ref 6–23)
CO2: 28 mEq/L (ref 19–32)
Calcium: 9.3 mg/dL (ref 8.4–10.5)
Chloride: 103 mEq/L (ref 96–112)
Creatinine, Ser: 0.88 mg/dL (ref 0.40–1.20)
GFR: 79.74 mL/min (ref >60.00)
Glucose, Bld: 106 mg/dL — ABNORMAL HIGH (ref 70–99)
Potassium: 4.2 mEq/L (ref 3.5–5.1)
Sodium: 143 mEq/L (ref 135–145)
Total Bilirubin: 0.4 mg/dL (ref 0.2–1.2)
Total Protein: 6.5 g/dL (ref 6.0–8.3)

## 2022-07-25 LAB — VITAMIN D 25 HYDROXY (VIT D DEFICIENCY, FRACTURES): VITD: 14.76 ng/mL — ABNORMAL LOW (ref 30.00–100.00)

## 2022-07-25 LAB — TSH: TSH: 0.09 u[IU]/mL — ABNORMAL LOW (ref 0.35–5.50)

## 2022-07-25 MED ORDER — ATORVASTATIN CALCIUM 10 MG PO TABS: 10.0000 mg | ORAL_TABLET | Freq: Every day | ORAL | 3 refills | Status: DC

## 2022-07-25 NOTE — Assessment & Plan Note (Deleted)
Ongoing occ wears supp socks in winter/too hot now  Lab today  Weight may play a role

## 2022-07-25 NOTE — Assessment & Plan Note (Signed)
Continues gyn care for this and infertility Currently off of meds and having regular period

## 2022-07-25 NOTE — Assessment & Plan Note (Signed)
TSH today  Pt takes DAW levothyroxine 200 mcg daily   Some more fatigue lately/otherwise clinically stable

## 2022-07-25 NOTE — Assessment & Plan Note (Signed)
Lab today  Taking otc D3

## 2022-07-25 NOTE — Progress Notes (Signed)
Subjective:    Patient ID: Nancy Dunlap, female    DOB: 1976/11/28, 46 y.o.   MRN: [id number]  HPI Pt presents for f/u oc chronic health problems including obesity and hypothyroidism   Wt Readings from Last 3 Encounters:  07/25/22 (!) 390 lb (176.9 kg)  06/01/21 (!) 389 lb 9 oz (176.7 kg)  04/23/21 (!) 340 lb (154.2 kg)   61.08 kg/m  Has a new grandchild   Feeling pretty good  Working- very busy     Last labs Office Visit on 06/01/2021  Component Date Value Ref Range Status   Sodium 06/01/2021 139  135 - 145 mEq/L Final   Potassium 06/01/2021 3.9  3.5 - 5.1 mEq/L Final   Chloride 06/01/2021 105  96 - 112 mEq/L Final   CO2 06/01/2021 26  19 - 32 mEq/L Final   Glucose, Bld 06/01/2021 97  70 - 99 mg/dL Final   BUN 06/01/2021 15  6 - 23 mg/dL Final   Creatinine, Ser 06/01/2021 0.90  0.40 - 1.20 mg/dL Final   GFR 06/01/2021 78.25  >60.00 mL/min Final   Calculated using the CKD-EPI Creatinine Equation (2021)   Calcium 06/01/2021 9.4  8.4 - 10.5 mg/dL Final   Total Bilirubin 06/01/2021 0.4  0.2 - 1.2 mg/dL Final   Bilirubin, Direct 06/01/2021 0.1  0.0 - 0.3 mg/dL Final   Alkaline Phosphatase 06/01/2021 73  39 - 117 U/L Final   AST 06/01/2021 26  0 - 37 U/L Final   ALT 06/01/2021 55 (H)  0 - 35 U/L Final   Total Protein 06/01/2021 6.7  6.0 - 8.3 g/dL Final   Albumin 06/01/2021 4.0  3.5 - 5.2 g/dL Final   Hypothyroidism  Pt has no clinical changes No change in hair or skin/ edema and no tremor More hair in drain-she does not notice loss however  Some fatigue  Lab Results  Component Value Date   TSH 0.96 05/25/2021    Takes levothyroxine 200 mcg daily, no missed doses  Due for labs   Does DAW at [facility]   Hyperlipidemia Lab Results  Component Value Date   CHOL 150 05/25/2021   HDL 51.10 05/25/2021   LDLCALC 70 05/25/2021   LDLDIRECT 126.4 04/10/2012   TRIG 142.0 05/25/2021   CHOLHDL 3 05/25/2021   Atorvastatin 10 mg daily in the past  Was trying  to get pregnant and she could not  Is ready to get back on the statin   Pedal edema is about the same  BP Readings from Last 3 Encounters:  07/25/22 126/82  06/01/21 136/88  04/23/21 130/73   Pulse Readings from Last 3 Encounters:  07/25/22 85  06/01/21 93  04/23/21 85    Exercise- is walking   Is considering trying nutra system  This would be a more convenient option  Husband is diabetic -wants to do together   Patient Active Problem List   Diagnosis Date Noted   Pedal edema 06/01/2021   Vitamin D deficiency 03/25/2021   Elevated liver transaminase level 03/25/2021   Grief reaction 03/25/2021   Endometriosis 12/12/2017   S/P laparotomy 11/17/2014   Routine general medical examination at a health care facility 03/16/2011   Hyperlipidemia 01/03/2010   Hypothyroidism 10/07/2007   OBESITY, MORBID 09/30/2007   Past Medical History:  Diagnosis Date   Bronchitis    just finished abx  11/01/14, has inhaler for bronchitis tx   Edema    Morbid obesity ([place])    Other  and unspecified hyperlipidemia    no meds   Unspecified hypothyroidism    Past Surgical History:  Procedure Laterality Date   EYE SURGERY     lazy eye as a child   LAPAROTOMY N/A 11/17/2014   Procedure: EXPLORATORY LAPAROTOMY, chromopertubation;  Surgeon: Turner Daniels, MD;  Location: [facility];  Service: Gynecology;  Laterality: N/A;   LYSIS OF ADHESION N/A 11/17/2014   Procedure: LYSIS OF ADHESION;  Surgeon: Turner Daniels, MD;  Location: [facility];  Service: Gynecology;  Laterality: N/A;   OVARIAN CYST REMOVAL Right 11/17/2014   Procedure: OVARIAN CYSTECTOMY;  Surgeon: Turner Daniels, MD;  Location: [facility];  Service: Gynecology;  Laterality: Right;   WISDOM TOOTH EXTRACTION     Social History   Tobacco Use   Smoking status: Former    Packs/day: 0.25    Years: 4.00    Total pack years: 1.00    Types: Cigarettes    Quit date: 12/18/1998    Years since quitting: 23.6   Smokeless tobacco: Never   Tobacco  comments:    Quit in college and was not an everyday smoker  Vaping Use   Vaping Use: Never used  Substance Use Topics   Alcohol use: Yes    Alcohol/week: 0.0 standard drinks of alcohol    Comment: socially   Drug use: No   Family History  Problem Relation Age of Onset   Hypertension Maternal Grandmother    Cancer Maternal Grandfather    COPD Maternal Grandfather    Stroke Paternal Grandfather    COPD Paternal Grandfather    Allergies  Allergen Reactions   Erythromycin Nausea And Vomiting    REACTION: n \\T \ v   Penicillins     REACTION: mom has rxn Patient has never taken PCN Her mom can take cephalosporins without problems   Soy Allergy Hives   Current Outpatient Medications on File Prior to Visit  Medication Sig Dispense Refill   SYNTHROID 200 MCG tablet TAKE 1 TABLET BY MOUTH DAILY BEFORE BREAKFAST 30 tablet 0   WIXELA INHUB 100-50 MCG/ACT AEPB INHALE 1 PUFF BY MOUTH TWICE DAILY. RINSE MOUTH AFTER USE 60 each 2   No current facility-administered medications on file prior to visit.    Review of Systems  Constitutional:  Negative for activity change, appetite change, fatigue, fever and unexpected weight change.  HENT:  Negative for congestion, ear pain, rhinorrhea, sinus pressure and sore throat.   Eyes:  Negative for pain, redness and visual disturbance.  Respiratory:  Negative for cough, shortness of breath and wheezing.   Cardiovascular:  Negative for chest pain and palpitations.  Gastrointestinal:  Negative for abdominal pain, blood in stool, constipation and diarrhea.  Endocrine: Negative for polydipsia and polyuria.  Genitourinary:  Negative for dysuria, frequency and urgency.       Endometriosis symptoms are not as bad lately   infertility  Musculoskeletal:  Negative for arthralgias, back pain and myalgias.  Skin:  Negative for pallor and rash.  Allergic/Immunologic: Negative for environmental allergies.  Neurological:  Negative for dizziness, syncope and  headaches.  Hematological:  Negative for adenopathy. Does not bruise/bleed easily.  Psychiatric/Behavioral:  Negative for decreased concentration and dysphoric mood. The patient is not nervous/anxious.        Objective:   Physical Exam Constitutional:      General: She is not in acute distress.    Appearance: She is well-developed. She is obese. She is not ill-appearing or diaphoretic.  HENT:  Head: Normocephalic and atraumatic.  Eyes:     General: No scleral icterus.    Conjunctiva/sclera: Conjunctivae normal.     Pupils: Pupils are equal, round, and reactive to light.  Neck:     Thyroid: No thyromegaly.     Vascular: No carotid bruit or JVD.  Cardiovascular:     Rate and Rhythm: Normal rate and regular rhythm.     Pulses: Normal pulses.     Heart sounds: Normal heart sounds.     No gallop.  Pulmonary:     Effort: Pulmonary effort is normal. No respiratory distress.     Breath sounds: Normal breath sounds. No wheezing or rales.  Abdominal:     General: There is no distension or abdominal bruit.     Palpations: Abdomen is soft.  Musculoskeletal:     Cervical back: Normal range of motion and neck supple.     Right lower leg: Edema present.     Left lower leg: Edema present.     Comments: Pedal edema-not pitting    Lymphadenopathy:     Cervical: No cervical adenopathy.  Skin:    General: Skin is warm and dry.     Coloration: Skin is not pale.     Findings: No rash.  Neurological:     Mental Status: She is alert.     Coordination: Coordination normal.     Deep Tendon Reflexes: Reflexes are normal and symmetric. Reflexes normal.  Psychiatric:        Mood and Affect: Mood normal.           Assessment & Plan:   Problem List Items Addressed This Visit       Endocrine   Hypothyroidism - Primary    TSH today  Pt takes DAW levothyroxine 200 mcg daily   Some more fatigue lately/otherwise clinically stable      Relevant Orders   TSH     Other   Elevated  liver transaminase level    Lab today  Has struggled with weight gain and plans to start a new program No abd c/o      Relevant Orders   Comprehensive metabolic panel   Endometriosis    Continues gyn care for this and infertility Currently off of meds and having regular period       Hyperlipidemia    Disc goals for lipids and reasons to control them Rev last labs with pt Rev low sat fat diet in detail  Lab today  Has been off of atorvastatin to try and conceive -but now stopped trying  Will re start this  Diet is fair Lab today      Relevant Medications   atorvastatin (LIPITOR) 10 MG tablet   Other Relevant Orders   Lipid panel   OBESITY, MORBID    Discussed how this problem influences overall health and the risks it imposes  Reviewed plan for weight loss with lower calorie diet (via better food choices and also portion control or program like weight watchers) and exercise building up to or more than 30 minutes 5 days per week including some aerobic activity   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

## 2022-07-25 NOTE — Assessment & Plan Note (Signed)
Ongoing occ wears supp socks in winter/too hot now  Lab today  Weight may play a role 

## 2022-07-25 NOTE — Assessment & Plan Note (Signed)
Disc goals for lipids and reasons to control them Rev last labs with pt Rev low sat fat diet in detail  Lab today  Has been off of atorvastatin to try and conceive -but now stopped trying  Will re start this  Diet is fair Lab today

## 2022-07-25 NOTE — Assessment & Plan Note (Signed)
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

## 2022-07-25 NOTE — Patient Instructions (Addendum)
Keep working on diet and exercise   Drink lots of water Labs today -will fill the levothyroxine when labs return     Get back on the atorvastatin daily   Take care of yourself

## 2022-07-25 NOTE — Assessment & Plan Note (Signed)
Lab today  Has struggled with weight gain and plans to start a new program No abd c/o

## 2022-08-06 ENCOUNTER — Other Ambulatory Visit: Payer: Self-pay | Admitting: Family Medicine

## 2022-08-14 ENCOUNTER — Other Ambulatory Visit: Payer: Self-pay | Admitting: Family Medicine

## 2022-08-17 NOTE — Telephone Encounter (Signed)
Spoke to [place] at the pharmacy to ask what she has been getting. She said 200 mcg for the last 2 years. Could not explain why the request was for 175. Either way, she needs to decrease to 175 and repeat labs in 6 weeks. She will start the 175 today and scheduled her a fasting lab appt 09-28-22 for TSH and lipids.

## 2022-08-17 NOTE — Telephone Encounter (Signed)
Called and spoke to pt to see if she knows the dosage she has been filling. According to this rx, she got 175 on 07-18-22. According to her chart, at her last OV she said she is on 200 mcg and Dr Milinda Antis was lowering it to 175. I advised the pt I will call [facility] when they open to the past refill history as to what she has been getting.

## 2022-09-25 ENCOUNTER — Telehealth: Payer: Self-pay | Admitting: Family Medicine

## 2022-09-25 DIAGNOSIS — E7800 Pure hypercholesterolemia, unspecified: Secondary | ICD-10-CM

## 2022-09-25 DIAGNOSIS — E039 Hypothyroidism, unspecified: Secondary | ICD-10-CM

## 2022-09-25 NOTE — Telephone Encounter (Signed)
-----   Message from Ellamae Sia sent at 09/22/2022  2:43 PM EDT ----- Regarding: Lab orders for Thursday, 10.12.23 Lab orders, thanks

## 2022-09-28 ENCOUNTER — Other Ambulatory Visit (INDEPENDENT_AMBULATORY_CARE_PROVIDER_SITE_OTHER): Payer: Managed Care, Other (non HMO)

## 2022-09-28 ENCOUNTER — Encounter: Payer: Self-pay | Admitting: Family Medicine

## 2022-09-28 ENCOUNTER — Other Ambulatory Visit: Payer: Self-pay | Admitting: Family

## 2022-09-28 DIAGNOSIS — E7800 Pure hypercholesterolemia, unspecified: Secondary | ICD-10-CM | POA: Diagnosis not present

## 2022-09-28 DIAGNOSIS — Z23 Encounter for immunization: Secondary | ICD-10-CM | POA: Diagnosis not present

## 2022-09-28 DIAGNOSIS — E039 Hypothyroidism, unspecified: Secondary | ICD-10-CM | POA: Diagnosis not present

## 2022-09-28 LAB — TSH: TSH: 0.31 u[IU]/mL — ABNORMAL LOW (ref 0.35–5.50)

## 2022-09-28 LAB — LIPID PANEL
Cholesterol: 127 mg/dL (ref 0–200)
HDL: 48 mg/dL (ref >39.00)
LDL Cholesterol: 57 mg/dL (ref 0–99)
NonHDL: 78.59
Total CHOL/HDL Ratio: 3
Triglycerides: 108 mg/dL (ref 0.0–149.0)
VLDL: 21.6 mg/dL (ref 0.0–40.0)

## 2022-09-28 LAB — AST: AST: 17 U/L (ref 0–37)

## 2022-09-28 LAB — ALT: ALT: 24 U/L (ref 0–35)

## 2022-09-28 MED ORDER — LEVOTHYROXINE SODIUM 150 MCG PO TABS: 150.0000 ug | ORAL_TABLET | Freq: Every day | ORAL | 3 refills | Status: DC

## 2022-09-29 MED ORDER — LEVOTHYROXINE SODIUM 150 MCG PO TABS: 150.0000 ug | ORAL_TABLET | Freq: Every day | ORAL | 3 refills | Status: DC

## 2022-11-15 ENCOUNTER — Ambulatory Visit
Admission: RE | Admit: 2022-11-15 | Discharge: 2022-11-15 | Disposition: A | Discharge status: Home / Self Care | Payer: Managed Care, Other (non HMO) | Source: Ambulatory Visit | Attending: Urgent Care | Admitting: Urgent Care

## 2022-11-15 VITALS — BP 129/85 | HR 80 | Temp 98.1°F | Resp 17

## 2022-11-15 DIAGNOSIS — B9789 Other viral agents as the cause of diseases classified elsewhere: Secondary | ICD-10-CM | POA: Diagnosis present

## 2022-11-15 DIAGNOSIS — J019 Acute sinusitis, unspecified: Secondary | ICD-10-CM | POA: Insufficient documentation

## 2022-11-15 DIAGNOSIS — N838 Other noninflammatory disorders of ovary, fallopian tube and broad ligament: Secondary | ICD-10-CM | POA: Insufficient documentation

## 2022-11-15 DIAGNOSIS — Z1152 Encounter for screening for COVID-19: Secondary | ICD-10-CM | POA: Insufficient documentation

## 2022-11-15 DIAGNOSIS — R6889 Other general symptoms and signs: Secondary | ICD-10-CM | POA: Insufficient documentation

## 2022-11-15 DIAGNOSIS — N7011 Chronic salpingitis: Secondary | ICD-10-CM | POA: Insufficient documentation

## 2022-11-15 DIAGNOSIS — E288 Other ovarian dysfunction: Secondary | ICD-10-CM | POA: Insufficient documentation

## 2022-11-15 LAB — POCT RAPID STREP A (OFFICE): Rapid Strep A Screen: NEGATIVE

## 2022-11-15 LAB — RESP PANEL BY RT-PCR (RSV, FLU A&B, COVID)  RVPGX2
Influenza A by PCR: NEGATIVE
Influenza B by PCR: NEGATIVE
Resp Syncytial Virus by PCR: POSITIVE — AB
SARS Coronavirus 2 by RT PCR: NEGATIVE

## 2022-11-15 MED ORDER — PREDNISONE 20 MG PO TABS: ORAL_TABLET | ORAL | 0 refills | Status: AC

## 2022-11-15 NOTE — ED Triage Notes (Signed)
Pt. Presents to UC w/ c/o a cough, chills, sore throat, and sinus pressure.

## 2022-11-15 NOTE — ED Provider Notes (Signed)
[facility]    CSN: [id number] Arrival date & time: 11/15/22  1353      History   Chief Complaint Chief Complaint  Patient presents with   Sore Throat    I have had congestion, sinus pressure, aches, chills, cough, very sore swollen throat but no fever. - Entered by patient   Chills   Cough   Facial Pain    HPI Nancy Dunlap is a 46 y.o. female.    Sore Throat  Cough   Presents to urgent care with complaint of cough, chills, sore throat, sinus pressure.  Symptoms x 5 days.  She states sinus pressure is severe and causing pain in her face especially under and behind her eyes  Denies documented fever.  Denies nausea, vomiting, diarrhea.  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   Hydrosalpinx 11/15/2022   Pedal edema 06/01/2021   Vitamin D deficiency 03/25/2021   Elevated liver transaminase level 03/25/2021   Grief reaction 03/25/2021   Endometriosis 12/12/2017   S/P laparotomy 11/17/2014   Routine general medical examination at a health care facility 03/16/2011   Hyperlipidemia 01/03/2010   Hypothyroidism 10/07/2007   OBESITY, MORBID 09/30/2007    Past Surgical History:  Procedure Laterality Date   EYE SURGERY     lazy eye as a child   LAPAROTOMY N/A 11/17/2014   Procedure: EXPLORATORY LAPAROTOMY, chromopertubation;  Surgeon: Turner Daniels, MD;  Location: [facility];  Service: Gynecology;  Laterality: N/A;   LYSIS OF ADHESION N/A 11/17/2014   Procedure: LYSIS OF ADHESION;  Surgeon: Turner Daniels, MD;  Location: [facility];  Service: Gynecology;  Laterality: N/A;   OVARIAN CYST REMOVAL Right 11/17/2014    Procedure: OVARIAN CYSTECTOMY;  Surgeon: Turner Daniels, MD;  Location: [facility];  Service: Gynecology;  Laterality: Right;   WISDOM TOOTH EXTRACTION      OB History   No obstetric history on file.      Home Medications    Prior to Admission medications   Medication Sig Start Date End Date Taking? Authorizing Provider  predniSONE (DELTASONE) 20 MG tablet Take 3 tablets (60 mg total) by mouth daily with breakfast for 2 days, THEN 2 tablets (40 mg total) daily with breakfast for 2 days, THEN 1 tablet (20 mg total) daily with breakfast for 2 days. 11/15/22 11/20/22 Yes Dhani Imel, Jeannett Senior, FNP  atorvastatin (LIPITOR) 10 MG tablet Take 1 tablet (10 mg total) by mouth daily. 07/25/22   Tower, Audrie Gallus, MD  levothyroxine (SYNTHROID) 150 MCG tablet Take 1 tablet (150 mcg total) by mouth daily before breakfast. 09/29/22   Tower, Audrie Gallus, MD  WIXELA INHUB 100-50 MCG/ACT AEPB INHALE 1 PUFF BY MOUTH TWICE DAILY. RINSE MOUTH AFTER USE 08/07/22   Tower, Audrie Gallus, MD    Family History Family History  Problem Relation Age of Onset   Hypertension Maternal Grandmother    Cancer Maternal Grandfather    COPD Maternal Grandfather    Stroke Paternal Grandfather    COPD Paternal Grandfather     Social History Social History   Tobacco Use  Smoking status: Former    Packs/day: 0.25    Years: 4.00    Total pack years: 1.00    Types: Cigarettes    Quit date: 12/18/1998    Years since quitting: 23.9   Smokeless tobacco: Never   Tobacco comments:    Quit in college and was not an everyday smoker  Vaping Use   Vaping Use: Never used  Substance Use Topics   Alcohol use: Yes    Alcohol/week: 0.0 standard drinks of alcohol    Comment: socially   Drug use: No     Allergies   Erythromycin, Penicillins, and Soy allergy   Review of Systems Review of Systems  Respiratory:  Positive for cough.      Physical Exam Triage Vital Signs ED Triage Vitals  Enc Vitals Group     BP 11/15/22 1407 129/85      Pulse Rate 11/15/22 1407 80     Resp 11/15/22 1407 17     Temp 11/15/22 1407 98.1 F (36.7 C)     Temp src --      SpO2 11/15/22 1407 96 %     Weight --      Height --      Head Circumference --      Peak Flow --      Pain Score 11/15/22 1409 2     Pain Loc --      Pain Edu? --      Excl. in GC? --    No data found.  Updated Vital Signs BP 129/85   Pulse 80   Temp 98.1 F (36.7 C)   Resp 17   LMP 11/01/2022 (Approximate)   SpO2 96%   Visual Acuity Right Eye Distance:   Left Eye Distance:   Bilateral Distance:    Right Eye Near:   Left Eye Near:    Bilateral Near:     Physical Exam Vitals reviewed.  Constitutional:      Appearance: She is well-developed. She is ill-appearing.  Cardiovascular:     Rate and Rhythm: Normal rate and regular rhythm.     Heart sounds: Normal heart sounds.  Pulmonary:     Effort: Pulmonary effort is normal.     Breath sounds: Normal breath sounds.  Skin:    General: Skin is warm and dry.  Neurological:     General: No focal deficit present.     Mental Status: She is alert and oriented to person, place, and time.      UC Treatments / Results  Labs (all labs ordered are listed, but only abnormal results are displayed) Labs Reviewed  RESP PANEL BY RT-PCR (RSV, FLU A&B, COVID)  [id number]  POCT RAPID STREP A (OFFICE)    EKG   Radiology No results found.  Procedures Procedures (including critical care time)  Medications Ordered in UC Medications - No data to display  Initial Impression / Assessment and Plan / UC Course  I have reviewed the triage vital signs and the nursing notes.  Pertinent labs & imaging results that were available during my care of the patient were reviewed by me and considered in my medical decision making (see chart for details).   Patient is afebrile here without recent antipyretics. Satting well on room air. Overall is ill appearing, though well hydrated and without respiratory distress. Pulmonary  exam is unremarkable.  Lungs CTAB without wheezes or rhonchi.  Bilateral maxillary sinus tenderness is present.  Suspect acute viral sinusitis and recommending use  of OTC Sudafed sinus as well as other OTC medications for symptom control.  Will also prescribe course of prednisone in the event Sudafed is inadequate at relieving her symptoms.  Discussed typical side effects of prednisone including hyperactivity, sleep disruption, anxiety.  Final Clinical Impressions(s) / UC Diagnoses   Final diagnoses:  Acute viral sinusitis     Discharge Instructions      Recommend use of Sudafed sinus (pseudoephedrine).  If you find this medication is ineffective and continued to have severe sinus symptoms, start the course of prednisone that I ordered.  Follow up here or with your primary care provider if your symptoms are worsening or not improving.      ED Prescriptions     Medication Sig Dispense Auth. Provider   predniSONE (DELTASONE) 20 MG tablet Take 3 tablets (60 mg total) by mouth daily with breakfast for 2 days, THEN 2 tablets (40 mg total) daily with breakfast for 2 days, THEN 1 tablet (20 mg total) daily with breakfast for 2 days. 12 tablet Daiveon Markman, FNP      PDMP not reviewed this encounter.   Charma Igo, [place] 11/15/22 1448

## 2022-11-15 NOTE — Discharge Instructions (Addendum)
Recommend use of Sudafed sinus (pseudoephedrine).  If you find this medication is ineffective and continued to have severe sinus symptoms, start the course of prednisone that I ordered.  Follow up here or with your primary care provider if your symptoms are worsening or not improving.

## 2023-01-16 ENCOUNTER — Other Ambulatory Visit: Payer: Self-pay | Admitting: Family Medicine

## 2023-01-27 ENCOUNTER — Other Ambulatory Visit: Payer: Self-pay | Admitting: Family Medicine

## 2023-01-29 ENCOUNTER — Encounter: Payer: Self-pay | Admitting: Family Medicine

## 2023-01-29 MED ORDER — LEVOTHYROXINE SODIUM 150 MCG PO TABS: 150.0000 ug | ORAL_TABLET | Freq: Every day | ORAL | 0 refills | Status: DC

## 2023-01-29 NOTE — Telephone Encounter (Signed)
Looks like pt never came in for her f/u labs to recheck thyroid, please advise

## 2023-02-25 ENCOUNTER — Other Ambulatory Visit: Payer: Self-pay | Admitting: Family Medicine

## 2023-02-27 ENCOUNTER — Telehealth: Payer: Self-pay | Admitting: Family Medicine

## 2023-02-27 DIAGNOSIS — E039 Hypothyroidism, unspecified: Secondary | ICD-10-CM

## 2023-02-27 NOTE — Telephone Encounter (Signed)
-----   Message from Ellamae Sia sent at 02/20/2023  2:31 PM EST ----- Regarding: Lab orders for Thursday, 3.14.24 Lab orders for Thyroid??

## 2023-03-01 ENCOUNTER — Other Ambulatory Visit (INDEPENDENT_AMBULATORY_CARE_PROVIDER_SITE_OTHER): Payer: Managed Care, Other (non HMO)

## 2023-03-01 DIAGNOSIS — E039 Hypothyroidism, unspecified: Secondary | ICD-10-CM | POA: Diagnosis not present

## 2023-03-01 LAB — TSH: TSH: 9.02 u[IU]/mL — ABNORMAL HIGH (ref 0.35–5.50)

## 2023-03-05 ENCOUNTER — Telehealth: Payer: Self-pay | Admitting: Family Medicine

## 2023-03-05 NOTE — Telephone Encounter (Signed)
Patient returned call regarding her labs. Would like a cb

## 2023-03-05 NOTE — Telephone Encounter (Signed)
See lab note for documentation  

## 2023-03-05 NOTE — Telephone Encounter (Signed)
Patient called in returning a call she received regarding her lab results. Thank you!

## 2023-03-06 ENCOUNTER — Telehealth: Payer: Self-pay | Admitting: *Deleted

## 2023-03-06 MED ORDER — LEVOTHYROXINE SODIUM 175 MCG PO TABS: 175.0000 ug | ORAL_TABLET | Freq: Every day | ORAL | 0 refills | Status: DC

## 2023-03-06 NOTE — Telephone Encounter (Signed)
addressed

## 2023-03-06 NOTE — Telephone Encounter (Signed)
Pt notified of lab results and Dr. Marliss Coots comments, lab appt scheduled and Rx sent to pharmacy

## 2023-03-06 NOTE — Telephone Encounter (Signed)
-----   Message from Abner Greenspan, MD sent at 03/01/2023  5:18 PM EDT ----- She is taking levothyroxine 150 mcg daily  TSH is high again meaning she needs more  If she has missed doses please let me know   If not please send in levothyroxine 175 mcg to take 1 daily in am at least 30 min before food or supplements  Re check TSH 6 wk please

## 2023-03-06 NOTE — Addendum Note (Signed)
Addended by: Tammi Sou on: 03/06/2023 09:04 AM   Modules accepted: Orders

## 2023-04-17 ENCOUNTER — Telehealth: Payer: Self-pay | Admitting: Family Medicine

## 2023-04-17 DIAGNOSIS — E039 Hypothyroidism, unspecified: Secondary | ICD-10-CM

## 2023-04-17 DIAGNOSIS — E559 Vitamin D deficiency, unspecified: Secondary | ICD-10-CM

## 2023-04-17 NOTE — Telephone Encounter (Signed)
-----   Message from Alvina Chou sent at 03/27/2023  3:43 PM EDT ----- Regarding: Lab orders for Wednesday, 5.1.24 Lab orders, thanks

## 2023-04-18 ENCOUNTER — Encounter: Payer: Self-pay | Admitting: Family Medicine

## 2023-04-18 ENCOUNTER — Other Ambulatory Visit: Payer: Self-pay | Admitting: Family Medicine

## 2023-04-18 ENCOUNTER — Other Ambulatory Visit (INDEPENDENT_AMBULATORY_CARE_PROVIDER_SITE_OTHER): Payer: Managed Care, Other (non HMO)

## 2023-04-18 DIAGNOSIS — E039 Hypothyroidism, unspecified: Secondary | ICD-10-CM | POA: Diagnosis not present

## 2023-04-18 DIAGNOSIS — E559 Vitamin D deficiency, unspecified: Secondary | ICD-10-CM

## 2023-04-18 LAB — VITAMIN D 25 HYDROXY (VIT D DEFICIENCY, FRACTURES): VITD: 24.46 ng/mL — ABNORMAL LOW (ref 30.00–100.00)

## 2023-04-18 LAB — TSH: TSH: 3.01 u[IU]/mL (ref 0.35–5.50)

## 2023-06-26 ENCOUNTER — Ambulatory Visit: Payer: Managed Care, Other (non HMO)

## 2023-06-26 ENCOUNTER — Ambulatory Visit: Payer: 59 | Admitting: Podiatry

## 2023-06-26 DIAGNOSIS — M722 Plantar fascial fibromatosis: Secondary | ICD-10-CM | POA: Diagnosis not present

## 2023-06-26 DIAGNOSIS — M79671 Pain in right foot: Secondary | ICD-10-CM | POA: Diagnosis not present

## 2023-06-26 MED ORDER — MELOXICAM 15 MG PO TABS
15.0000 mg | ORAL_TABLET | Freq: Every day | ORAL | 1 refills | Status: DC
Start: 2023-06-26 — End: 2023-10-17

## 2023-06-26 MED ORDER — METHYLPREDNISOLONE 4 MG PO TBPK
ORAL_TABLET | ORAL | 0 refills | Status: DC
Start: 2023-06-26 — End: 2023-10-17

## 2023-06-26 MED ORDER — BETAMETHASONE SOD PHOS & ACET 6 (3-3) MG/ML IJ SUSP
3.0000 mg | Freq: Once | INTRAMUSCULAR | Status: AC
Start: 2023-06-26 — End: 2023-06-26
  Administered 2023-06-26: 3 mg via INTRA_ARTICULAR

## 2023-06-26 NOTE — Progress Notes (Signed)
   Chief Complaint  Patient presents with   Foot Pain    Patient came in today for left foot heel pain and swelling, started 2 months ago, rate of pain 3 out of 10, in the morning the pain can be a 8 out of 10, sharp pain, X-Rays done today    Subjective: 47 y.o. female presents today for evaluation of left heel pain ongoing for about 2 months now.  She has not anything for treatment.  Idiopathic onset.  Presenting for further treatment and evaluation   Past Medical History:  Diagnosis Date   Bronchitis    just finished abx  11/01/14, has inhaler for bronchitis tx   Edema    Morbid obesity (HCC)    Other and unspecified hyperlipidemia    no meds   Unspecified hypothyroidism      Objective: Physical Exam General: The patient is alert and oriented x3 in no acute distress.  Dermatology: Skin is warm, dry and supple bilateral lower extremities. Negative for open lesions or macerations bilateral.   Vascular: Dorsalis Pedis and Posterior Tibial pulses palpable bilateral.  Capillary fill time is immediate to all digits.  Neurological: Epicritic and protective threshold intact bilateral.   Musculoskeletal: Tenderness to palpation to the plantar aspect of the left heel along the plantar fascia. All other joints range of motion within normal limits bilateral. Strength 5/5 in all groups bilateral.   Radiographic exam LT foot 06/26/2023: Normal osseous mineralization. Joint spaces preserved. No fracture/dislocation/boney destruction. No other soft tissue abnormalities or radiopaque foreign bodies. Plantar heel spur noted.   Assessment: 1. Plantar fasciitis left foot  Plan of Care:  1. Patient evaluated. Xrays reviewed.   2. Injection of 0.5cc Celestone soluspan injected into the left plantar fascia.  3. Rx for Medrol Dose Pak placed 4. Rx for Meloxicam ordered for patient. 5. Plantar fascial band(s) dispensed  6. Instructed patient regarding therapies and modalities at home to  alleviate symptoms.  7. Return to clinic in 4 weeks.    *Attorney   Felecia Shelling, DPM [facility]  Dr. Felecia Shelling, DPM    [address] [place], [place] [address]                Office [phone]  Fax [phone]

## 2023-07-13 ENCOUNTER — Other Ambulatory Visit: Payer: Self-pay | Admitting: Family Medicine

## 2023-07-24 ENCOUNTER — Ambulatory Visit: Payer: Self-pay | Admitting: Podiatry

## 2023-10-02 ENCOUNTER — Other Ambulatory Visit: Payer: Self-pay | Admitting: Family Medicine

## 2023-10-02 NOTE — Telephone Encounter (Signed)
Pt hasn't been seen in over a year. Please schedule a CPE (labs prior) or at least a med refill f/u appt and route back to me to refill. thanks

## 2023-10-02 NOTE — Telephone Encounter (Signed)
LVM for patient to c/b and schedule.

## 2023-10-03 NOTE — Telephone Encounter (Signed)
Patient has been scheduled

## 2023-10-15 ENCOUNTER — Ambulatory Visit: Payer: Managed Care, Other (non HMO) | Admitting: Family Medicine

## 2023-10-17 ENCOUNTER — Encounter: Payer: Self-pay | Admitting: Family Medicine

## 2023-10-17 ENCOUNTER — Ambulatory Visit (INDEPENDENT_AMBULATORY_CARE_PROVIDER_SITE_OTHER): Payer: Managed Care, Other (non HMO) | Admitting: Family Medicine

## 2023-10-17 VITALS — BP 132/81 | HR 84 | Temp 97.7°F | Ht 67.0 in | Wt >= 6400 oz

## 2023-10-17 DIAGNOSIS — Z23 Encounter for immunization: Secondary | ICD-10-CM

## 2023-10-17 DIAGNOSIS — E559 Vitamin D deficiency, unspecified: Secondary | ICD-10-CM

## 2023-10-17 DIAGNOSIS — E039 Hypothyroidism, unspecified: Secondary | ICD-10-CM

## 2023-10-17 DIAGNOSIS — E288 Other ovarian dysfunction: Secondary | ICD-10-CM

## 2023-10-17 DIAGNOSIS — Z1231 Encounter for screening mammogram for malignant neoplasm of breast: Secondary | ICD-10-CM | POA: Insufficient documentation

## 2023-10-17 DIAGNOSIS — Z131 Encounter for screening for diabetes mellitus: Secondary | ICD-10-CM | POA: Diagnosis not present

## 2023-10-17 DIAGNOSIS — N970 Female infertility associated with anovulation: Secondary | ICD-10-CM

## 2023-10-17 DIAGNOSIS — E7800 Pure hypercholesterolemia, unspecified: Secondary | ICD-10-CM

## 2023-10-17 DIAGNOSIS — R7303 Prediabetes: Secondary | ICD-10-CM | POA: Insufficient documentation

## 2023-10-17 LAB — COMPREHENSIVE METABOLIC PANEL
ALT: 17 U/L (ref 0–35)
AST: 13 U/L (ref 0–37)
Albumin: 3.9 g/dL (ref 3.5–5.2)
Alkaline Phosphatase: 64 U/L (ref 39–117)
BUN: 14 mg/dL (ref 6–23)
CO2: 30 meq/L (ref 19–32)
Calcium: 9.3 mg/dL (ref 8.4–10.5)
Chloride: 105 meq/L (ref 96–112)
Creatinine, Ser: 0.92 mg/dL (ref 0.40–1.20)
GFR: 74.95 mL/min (ref >60.00)
Glucose, Bld: 102 mg/dL — ABNORMAL HIGH (ref 70–99)
Potassium: 4.4 meq/L (ref 3.5–5.1)
Sodium: 141 meq/L (ref 135–145)
Total Bilirubin: 0.4 mg/dL (ref 0.2–1.2)
Total Protein: 6.4 g/dL (ref 6.0–8.3)

## 2023-10-17 LAB — LIPID PANEL
Cholesterol: 137 mg/dL (ref 0–200)
HDL: 50.1 mg/dL (ref >39.00)
LDL Cholesterol: 68 mg/dL (ref 0–99)
NonHDL: 86.76
Total CHOL/HDL Ratio: 3
Triglycerides: 96 mg/dL (ref 0.0–149.0)
VLDL: 19.2 mg/dL (ref 0.0–40.0)

## 2023-10-17 LAB — TSH: TSH: 2.39 u[IU]/mL (ref 0.35–5.50)

## 2023-10-17 LAB — HEMOGLOBIN A1C: Hgb A1c MFr Bld: 5.9 % (ref 4.6–6.5)

## 2023-10-17 LAB — VITAMIN D 25 HYDROXY (VIT D DEFICIENCY, FRACTURES): VITD: 36.55 ng/mL (ref 30.00–100.00)

## 2023-10-17 MED ORDER — WEGOVY 0.25 MG/0.5ML ~~LOC~~ SOAJ: 0.2500 mg | SUBCUTANEOUS | 0 refills | Status: DC

## 2023-10-17 NOTE — Assessment & Plan Note (Signed)
Pt has bmi of 62.6  A1c added to labs

## 2023-10-17 NOTE — Assessment & Plan Note (Signed)
Pt has stopped trying for pregnancy Thinks she is perimenopausal  Planning follow up with her gyn

## 2023-10-17 NOTE — Progress Notes (Signed)
Subjective:    Patient ID: Nancy Dunlap, female    DOB: 25-May-1976, 47 y.o.   MRN: [id number]  HPI  Wt Readings from Last 3 Encounters:  10/17/23 (!) 400 lb 4 oz (181.6 kg)  07/25/22 (!) 390 lb (176.9 kg)  06/01/21 (!) 389 lb 9 oz (176.7 kg)   62.69 kg/m  Vitals:   10/17/23 0836 10/17/23 0858  BP: (!) 146/90 132/81  Pulse: 84   Temp: 97.7 F (36.5 C)   SpO2: 95%     Pt presents for follow up of chronic health problems including hypothyroidism and hyperlipidemia  Flu shot    Hypothyroidism  Pt has no clinical changes No change in energy level/ hair or skin/ edema and no tremor Lab Results  Component Value Date   TSH 2.39 10/17/2023    Takes DAW levothyroxine 175 mcg daily  About the same    Hyperlipidemia Lab Results  Component Value Date   CHOL 137 10/17/2023   HDL 50.10 10/17/2023   LDLCALC 68 10/17/2023   LDLDIRECT 126.4 04/10/2012   TRIG 96.0 10/17/2023   CHOLHDL 3 10/17/2023   Atorvastatin 10 mg daily   Stays away from the fatty foods  Busy at work -tries to fit  in walking when you can   Lab Results  Component Value Date   ALT 17 10/17/2023   AST 13 10/17/2023   ALKPHOS 64 10/17/2023   BILITOT 0.4 10/17/2023    Vit d def Last vitamin D Lab Results  Component Value Date   VD25OH 36.55 10/17/2023   Was encouraged to increase D3 to 6000 international units daily  Cannot take ergocalciferol due to possible allergy to soy  Sees gyn in [place]   Mammogram is due   Periods are on /off  She is no longer trying to get pregnant   Not yet interested in colon cancer screening     Patient Active Problem List   Diagnosis Date Noted   Diabetes mellitus screening 10/17/2023   Encounter for screening mammogram for breast cancer 10/17/2023   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   Hydrosalpinx 11/15/2022   Pedal edema 06/01/2021   Vitamin D deficiency 03/25/2021    Elevated liver transaminase level 03/25/2021   Grief reaction 03/25/2021   Endometriosis 12/12/2017   S/P laparotomy 11/17/2014   Routine general medical examination at a health care facility 03/16/2011   Hyperlipidemia 01/03/2010   Hypothyroidism 10/07/2007   OBESITY, MORBID 09/30/2007   Past Medical History:  Diagnosis Date   Bronchitis    just finished abx  11/01/14, has inhaler for bronchitis tx   Edema    Morbid obesity (HCC)    Other and unspecified hyperlipidemia    no meds   Unspecified hypothyroidism    Past Surgical History:  Procedure Laterality Date   EYE SURGERY     lazy eye as a child   LAPAROTOMY N/A 11/17/2014   Procedure: EXPLORATORY LAPAROTOMY, chromopertubation;  Surgeon: Turner Daniels, MD;  Location: [facility];  Service: Gynecology;  Laterality: N/A;   LYSIS OF ADHESION N/A 11/17/2014   Procedure: LYSIS OF ADHESION;  Surgeon: Turner Daniels, MD;  Location: [facility];  Service: Gynecology;  Laterality: N/A;   OVARIAN CYST REMOVAL Right 11/17/2014   Procedure: OVARIAN CYSTECTOMY;  Surgeon: Turner Daniels, MD;  Location: [facility];  Service: Gynecology;  Laterality: Right;   WISDOM TOOTH EXTRACTION     Social History  Tobacco Use   Smoking status: Former    Current packs/day: 0.00    Average packs/day: 0.3 packs/day for 4.0 years (1.0 ttl pk-yrs)    Types: Cigarettes    Start date: 12/18/1994    Quit date: 12/18/1998    Years since quitting: 24.8   Smokeless tobacco: Never   Tobacco comments:    Quit in college and was not an everyday smoker  Vaping Use   Vaping status: Never Used  Substance Use Topics   Alcohol use: Yes    Alcohol/week: 0.0 standard drinks of alcohol    Comment: socially   Drug use: No   Family History  Problem Relation Age of Onset   Hypertension Maternal Grandmother    Cancer Maternal Grandfather    COPD Maternal Grandfather    Stroke Paternal Grandfather    COPD Paternal Grandfather    Allergies  Allergen Reactions   Erythromycin Nausea  And Vomiting    REACTION: n \\T \ v   Penicillins     REACTION: mom has rxn Patient has never taken PCN Her mom can take cephalosporins without problems   Soy Allergy Hives   Current Outpatient Medications on File Prior to Visit  Medication Sig Dispense Refill   atorvastatin (LIPITOR) 10 MG tablet TAKE 1 TABLET(10 MG) BY MOUTH DAILY 90 tablet 0   fluticasone-salmeterol (WIXELA INHUB) 100-50 MCG/ACT AEPB INHALE 1 PUFF BY MOUTH TWICE DAILY. RINSE MOUTH AFTER USE 60 each 0   levothyroxine (SYNTHROID) 175 MCG tablet TAKE 1 TABLET BY MOUTH DAILY BEFORE BREAKFAST 90 tablet 2   No current facility-administered medications on file prior to visit.    Review of Systems  Constitutional:  Negative for activity change, appetite change, fatigue, fever and unexpected weight change.  HENT:  Negative for congestion, ear pain, rhinorrhea, sinus pressure and sore throat.   Eyes:  Negative for pain, redness and visual disturbance.  Respiratory:  Negative for cough, shortness of breath and wheezing.   Cardiovascular:  Negative for chest pain and palpitations.  Gastrointestinal:  Negative for abdominal pain, blood in stool, constipation and diarrhea.  Endocrine: Negative for polydipsia and polyuria.  Genitourinary:  Negative for dysuria, frequency and urgency.  Musculoskeletal:  Negative for arthralgias, back pain and myalgias.  Skin:  Negative for pallor and rash.  Allergic/Immunologic: Negative for environmental allergies.  Neurological:  Negative for dizziness, syncope and headaches.  Hematological:  Negative for adenopathy. Does not bruise/bleed easily.  Psychiatric/Behavioral:  Negative for decreased concentration and dysphoric mood. The patient is not nervous/anxious.        Objective:   Physical Exam Constitutional:      General: She is not in acute distress.    Appearance: Normal appearance. She is well-developed. She is obese. She is not ill-appearing or diaphoretic.  HENT:     Head:  Normocephalic and atraumatic.     Mouth/Throat:     Mouth: Mucous membranes are moist.  Eyes:     General: No scleral icterus.    Conjunctiva/sclera: Conjunctivae normal.     Pupils: Pupils are equal, round, and reactive to light.  Neck:     Thyroid: No thyromegaly.     Vascular: No carotid bruit or JVD.  Cardiovascular:     Rate and Rhythm: Normal rate and regular rhythm.     Heart sounds: Normal heart sounds.     No gallop.  Pulmonary:     Effort: Pulmonary effort is normal. No respiratory distress.     Breath sounds: Normal breath  sounds. No stridor. No wheezing, rhonchi or rales.  Abdominal:     General: There is no distension or abdominal bruit.     Palpations: Abdomen is soft. There is no mass.     Tenderness: There is no abdominal tenderness.  Musculoskeletal:     Cervical back: Normal range of motion and neck supple.     Right lower leg: No edema.     Left lower leg: No edema.  Lymphadenopathy:     Cervical: No cervical adenopathy.  Skin:    General: Skin is warm and dry.     Coloration: Skin is not pale.     Findings: No rash.     Comments: Fair Solar lentigines diffusely   Neurological:     Mental Status: She is alert.     Coordination: Coordination normal.     Deep Tendon Reflexes: Reflexes are normal and symmetric. Reflexes normal.  Psychiatric:        Mood and Affect: Mood normal.           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    Pt has stopped trying for pregnancy Thinks she is perimenopausal  Planning follow up with her gyn      Hypothyroidism    Hypothyroidism  Pt has no clinical changes (fatigue is baseline) No change in energy level/ hair or skin/ edema and no tremor Lab Results  Component Value Date   TSH 3.01 04/18/2023    Takes daw 175 mcg levothyroxine daily   Lab today       Relevant Orders   TSH (Completed)     Other    Diabetes mellitus screening    Pt has bmi of 62.6  A1c added to labs       Relevant Orders   Hemoglobin A1c (Completed)   Encounter for screening mammogram for breast cancer    Referred for first mammogram  Wants to do the mobile mammo  Will call for appt      Relevant Orders   MM 3D SCREENING MAMMOGRAM BILATERAL BREAST   Hyperlipidemia    Disc goals for lipids and reasons to control them Rev last labs with pt Rev low sat fat diet in detail Labs today  Taking atorvastatin 10 mg daily      Relevant Orders   Comprehensive metabolic panel (Completed)   Lipid panel (Completed)   OBESITY, MORBID - Primary    Discussed how this problem influences overall health and the risks it imposes  Reviewed plan for weight loss with lower calorie diet (via better food choices (lower glycemic and portion control) along with exercise building up to or more than 30 minutes 5 days per week including some aerobic activity and strength training   Tried nutra system-did not work  May be candidate for GLP-1 in future if price comes down Briefly discussed contrave- she may check on coverage A1c added to labs today.      Vitamin D deficiency    D level today  Taking 6000 international units D3 daily now         Relevant Orders   VITAMIN D 25 Hydroxy (Vit-D Deficiency, Fractures) (Completed)   Other Visit Diagnoses     Need for influenza vaccination       Relevant Orders   Flu vaccine trivalent PF, 6mos and older(Flulaval,Afluria,Fluarix,Fluzone) (Completed)

## 2023-10-17 NOTE — Patient Instructions (Addendum)
Keep walking  Add some strength training to your routine, this is important for bone and brain health and can reduce your risk of falls and help your body use insulin properly and regulate weight  Light weights, exercise bands , and internet videos are a good way to start  Yoga (chair or regular), machines , floor exercises or a gym with machines are also good options    Flu shot today   Try to get most of your carbohydrates from produce (with the exception of white potatoes) and whole grains Eat less bread/pasta/rice/snack foods/cereals/sweets and other items from the middle of the grocery store (processed carbs)   Get back on track for gyn care   You have an order for:  []   2D Mammogram  [x]   3D Mammogram  []   Bone Density     Please call for appointment:   []   [facility]  [address] [place] [address]  [phone]  []   [facility])   [address]  [place], [place] [address]  [phone]  []   [facility]      [address] [place], [place]        [phone]         []   [facility]  [address] [place], [place]  [phone]  []  [facility]   [address]   [place], [place] [address]  [phone]  []  [facility]  [address] [place], [place] [address] [phone]    Make sure to wear two piece clothing  No lotions powders or deodorants the day of the appointment Make sure to bring picture ID and insurance card.  Bring list of medications you are currently taking including any supplements.   Schedule your screening mammogram through MyChart!   Select [facility] imaging sites can now be scheduled through MyChart.  Log into your MyChart account.  Go to 'Visit' (or 'Appointments' if  on  mobile App) --> Schedule an  Appointment  Under 'Select a Reason for Visit' choose the Mammogram  Screening option.  Complete the pre-visit questions  and select the time and place that  best fits your schedule

## 2023-10-17 NOTE — Assessment & Plan Note (Addendum)
Discussed how this problem influences overall health and the risks it imposes  Reviewed plan for weight loss with lower calorie diet (via better food choices (lower glycemic and portion control) along with exercise building up to or more than 30 minutes 5 days per week including some aerobic activity and strength training   Tried nutra system-did not work  May be candidate for GLP-1 in future if price comes down Briefly discussed contrave- she may check on coverage A1c added to labs today.

## 2023-10-17 NOTE — Assessment & Plan Note (Signed)
Hypothyroidism  Pt has no clinical changes (fatigue is baseline) No change in energy level/ hair or skin/ edema and no tremor Lab Results  Component Value Date   TSH 3.01 04/18/2023    Takes daw 175 mcg levothyroxine daily   Lab today

## 2023-10-17 NOTE — Assessment & Plan Note (Signed)
Referred for first mammogram  Wants to do the mobile mammo  Will call for appt

## 2023-10-17 NOTE — Assessment & Plan Note (Signed)
Disc goals for lipids and reasons to control them Rev last labs with pt Rev low sat fat diet in detail Labs today  Taking atorvastatin 10 mg daily

## 2023-10-17 NOTE — Assessment & Plan Note (Signed)
D level today  Taking 6000 international units D3 daily now

## 2023-10-18 ENCOUNTER — Other Ambulatory Visit: Payer: Self-pay | Admitting: Family Medicine

## 2023-11-01 ENCOUNTER — Other Ambulatory Visit: Payer: Self-pay | Admitting: Family Medicine

## 2023-11-01 NOTE — Telephone Encounter (Signed)
Completed as much as I could on form and placed it in your inbox for review

## 2023-11-02 ENCOUNTER — Other Ambulatory Visit (HOSPITAL_COMMUNITY): Payer: Self-pay

## 2023-11-04 NOTE — Telephone Encounter (Signed)
Done, will put in IN box

## 2023-11-05 ENCOUNTER — Telehealth: Payer: Self-pay

## 2023-11-05 NOTE — Telephone Encounter (Signed)
Pharmacy Patient Advocate Encounter   Received notification from Fax that prior authorization for [facility] 0.25mg /0.38ml is required/requested.   Insurance verification completed.   The patient is insured through [facility] .   Per test claim: PA required; PA submitted to above mentioned insurance via Prompt PA Key/confirmation #/EOC [id number] Status is pending

## 2023-11-07 ENCOUNTER — Other Ambulatory Visit (HOSPITAL_COMMUNITY): Payer: Self-pay

## 2023-11-07 NOTE — Telephone Encounter (Signed)
Sent mychart letting pt know, PA approved

## 2023-11-07 NOTE — Telephone Encounter (Signed)
Pharmacy Patient Advocate Encounter  Received notification from [facility] that Prior Authorization for Wegovy 0.25MG /0.5ML auto-injectors has been APPROVED from 11/05/2023 to 05/04/2024. Ran test claim, Copay is $24.99. This test claim was processed through [facility]- copay amounts may vary at other pharmacies due to pharmacy/plan contracts, or as the patient moves through the different stages of their insurance plan.   PA #/Case ID/Reference #: [id number]

## 2023-11-29 ENCOUNTER — Ambulatory Visit
Admission: RE | Admit: 2023-11-29 | Discharge: 2023-11-29 | Disposition: A | Discharge status: Home / Self Care | Payer: Managed Care, Other (non HMO) | Source: Ambulatory Visit | Attending: Family Medicine | Admitting: Family Medicine

## 2023-11-29 DIAGNOSIS — Z1231 Encounter for screening mammogram for malignant neoplasm of breast: Secondary | ICD-10-CM

## 2023-12-01 ENCOUNTER — Other Ambulatory Visit: Payer: Self-pay | Admitting: Family Medicine

## 2023-12-03 ENCOUNTER — Other Ambulatory Visit: Payer: Self-pay | Admitting: Family Medicine

## 2023-12-03 MED ORDER — WEGOVY 0.25 MG/0.5ML ~~LOC~~ SOAJ: 0.2500 mg | SUBCUTANEOUS | 0 refills | Status: DC

## 2023-12-03 NOTE — Telephone Encounter (Signed)
See mychart message. Pt is asking Rx to be changed to [facility] because [facility] can't fill med. Last med refill f/u was on 10/17/23 and that's when med was filled also 10/17/23 # 2 mL/ 0 refills

## 2023-12-23 ENCOUNTER — Encounter: Payer: Self-pay | Admitting: Family Medicine

## 2023-12-24 MED ORDER — SEMAGLUTIDE-WEIGHT MANAGEMENT 0.5 MG/0.5ML ~~LOC~~ SOAJ: 0.5000 mg | SUBCUTANEOUS | 0 refills | Status: DC

## 2023-12-24 NOTE — Telephone Encounter (Signed)
 Will route to PCP and Prior Auth dpt

## 2024-01-01 NOTE — Telephone Encounter (Signed)
 Copied from CRM [id number]. Topic: General - Other >> Jan 01, 2024  4:26 PM Rosina BIRCH wrote: Reason for CRM: Pt husband called wanting an update on the PA for wegovy . Pt husband wanted to know if the provider can contact the prior authorization team to get a status update because the insurance stated that they have not heard from the doctor as of 1/14

## 2024-01-01 NOTE — Telephone Encounter (Signed)
 Will route this to management here because pt has been trying to get PA since 12/23/23, not sure how to proceed.

## 2024-01-02 NOTE — Telephone Encounter (Signed)
 PA created on covermymeds. Key: [id number].  Mychart message sent to pt to make her aware.

## 2024-01-03 ENCOUNTER — Telehealth: Payer: Self-pay

## 2024-01-03 ENCOUNTER — Other Ambulatory Visit (HOSPITAL_COMMUNITY): Payer: Self-pay

## 2024-01-03 NOTE — Telephone Encounter (Signed)
Pharmacy Patient Advocate Encounter  Received notification from [employer] that Prior Authorization for Wegovy 0.5MG /0.5ML auto-injectors has been APPROVED from 01/02/24 to 08/01/24   PA #/Case ID/Reference #:  [id number]

## 2024-01-07 ENCOUNTER — Other Ambulatory Visit: Payer: Self-pay | Admitting: Family Medicine

## 2024-02-17 ENCOUNTER — Other Ambulatory Visit: Payer: Self-pay | Admitting: Family Medicine

## 2024-02-19 NOTE — Telephone Encounter (Signed)
 Last refill 12/24/23 2mL w/ 0 refiils  LOV 10/17/23 for f/u  NOV Not scheduled.

## 2024-02-19 NOTE — Telephone Encounter (Signed)
 Do you want to go up to the next dose? How are you doing with it ?  Please schedule follow up this spring

## 2024-02-22 ENCOUNTER — Encounter: Payer: Self-pay | Admitting: Family Medicine

## 2024-02-22 NOTE — Telephone Encounter (Signed)
 Pt sent a message with a more in depth answer to your question. Please see pt's mychart message before you respond. Thanks

## 2024-02-22 NOTE — Telephone Encounter (Signed)
 Left VM requesting pt to call the office back

## 2024-02-22 NOTE — Telephone Encounter (Signed)
 Pt said that this does is causing some stomach upset. It's not severe but she isn't sure if she should increase the dose or stay on this dose she's on until her stomach feels a little better.

## 2024-02-28 ENCOUNTER — Other Ambulatory Visit: Payer: Self-pay | Admitting: Family Medicine

## 2024-02-28 MED ORDER — WEGOVY 0.25 MG/0.5ML ~~LOC~~ SOAJ: 0.2500 mg | SUBCUTANEOUS | 3 refills | Status: DC

## 2024-04-09 ENCOUNTER — Encounter: Payer: Self-pay | Admitting: Family Medicine

## 2024-04-09 ENCOUNTER — Ambulatory Visit (INDEPENDENT_AMBULATORY_CARE_PROVIDER_SITE_OTHER): Admitting: Family Medicine

## 2024-04-09 DIAGNOSIS — R7303 Prediabetes: Secondary | ICD-10-CM

## 2024-04-09 DIAGNOSIS — E039 Hypothyroidism, unspecified: Secondary | ICD-10-CM

## 2024-04-09 NOTE — Assessment & Plan Note (Signed)
 Pt tolerates wegovy  at 0.25 mg but no higher  It is helping appetite and weight is down 8 lb so far  Discussed importance of adding strength training exercise like pilates/weights  Discussed importance of lean protein  Also needs to work on decreasing emotional eating also -strategies for that reviewed, not interested in counseling currently   Will continue 0.25 mg weekly  Will call if concerns or side effects   Follow up 6 mo for annual exam

## 2024-04-09 NOTE — Patient Instructions (Addendum)
 Keep walking  Add some strength training to your routine, this is important for bone and brain health and can reduce your risk of falls and help your body use insulin properly and regulate weight  Light weights, exercise bands , and internet videos are a good way to start  Yoga (chair or regular), machines , floor exercises or a gym with machines are also good options   Pilates is a great option   Goal is to build muscle and prevent muscle loss   Think about some other coping mechanisms for stress that are not food  Meditation , hobbies, walking , talking to someone  Counseling is an option   Try to get most of your carbohydrates from produce (with the exception of white potatoes) and whole grains Eat less bread/pasta/rice/snack foods/cereals/sweets and other items from the middle of the grocery store (processed carbs)  Get lean protein with every meal  Make sure you protein drinks are low sugar

## 2024-04-09 NOTE — Assessment & Plan Note (Signed)
 Hypothyroidism  Pt has no clinical changes (fatigue is baseline) No change in energy level/ hair or skin/ edema and no tremor Lab Results  Component Value Date   TSH 2.39 10/17/2023    Takes daw 175 mcg levothyroxine  daily   Lab today

## 2024-04-09 NOTE — Assessment & Plan Note (Signed)
 Lab Results  Component Value Date   HGBA1C 5.9 10/17/2023   disc imp of low glycemic diet and wt loss to prevent DM2

## 2024-04-09 NOTE — Progress Notes (Signed)
 Subjective:    Patient ID: Nancy Dunlap, female    DOB: [birth year], 48 y.o.   MRN: [id number]  HPI  Wt Readings from Last 3 Encounters:  04/09/24 (!) 392 lb (177.8 kg)  10/17/23 (!) 400 lb 4 oz (181.6 kg)  07/25/22 (!) 390 lb (176.9 kg)   61.40 kg/m  Vitals:   04/09/24 0847  BP: 132/84  Pulse: 82  Temp: 98.4 F (36.9 C)  SpO2: 94%    Pt presents for follow up of obesity and chronic medical problems including hypothyroidism and hyperlipidemia   Started wegovy  and did not tolerate going up on the dose  0.25 mg weekly now   Did not tolerate 0.5 dose- was vomiting the whole time  Had both constipation and diarrhea also    Feels ok on lower dose  Some less frequent bms - that is tolerable  Does not make her feel sick   It helps appetite some  Feels more full  Less hungry also   Is mindful about protein / vegetables and less carbs   Exercise Walking  Planning to start at a pilates studio   Some emotional eating  Stress is a trigger  Trying to be mindful about it     Hypothyroidism  Pt has no clinical changes No change in energy level/ hair or skin/ edema and no tremor Lab Results  Component Value Date   TSH 2.39 10/17/2023    Levothyroxine  175 mcg daily   Hyperlipidemia  Lab Results  Component Value Date   CHOL 137 10/17/2023   HDL 50.10 10/17/2023   LDLCALC 68 10/17/2023   LDLDIRECT 126.4 04/10/2012   TRIG 96.0 10/17/2023   CHOLHDL 3 10/17/2023   Atorvastatin  10 mg daily    Lab Results  Component Value Date   ALT 17 10/17/2023   AST 13 10/17/2023   ALKPHOS 64 10/17/2023   BILITOT 0.4 10/17/2023   Prediabetes Lab Results  Component Value Date   HGBA1C 5.9 10/17/2023     Patient Active Problem List   Diagnosis Date Noted   Encounter for screening mammogram for breast cancer 10/17/2023   Prediabetes 10/17/2023   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    Hydrosalpinx 11/15/2022   Pedal edema 06/01/2021   Vitamin D  deficiency 03/25/2021   Elevated liver transaminase level 03/25/2021   Grief reaction 03/25/2021   Endometriosis 12/12/2017   S/P laparotomy 11/17/2014   Routine general medical examination at a health care facility 03/16/2011   Hyperlipidemia 01/03/2010   Hypothyroidism 10/07/2007   OBESITY, MORBID 09/30/2007   Past Medical History:  Diagnosis Date   Bronchitis    just finished abx  11/01/14, has inhaler for bronchitis tx   Edema    Morbid obesity (HCC)    Other and unspecified hyperlipidemia    no meds   Unspecified hypothyroidism    Past Surgical History:  Procedure Laterality Date   EYE SURGERY     lazy eye as a child   LAPAROTOMY N/A 11/17/2014   Procedure: EXPLORATORY LAPAROTOMY, chromopertubation;  Surgeon: Denette Finner, MD;  Location: [facility];  Service: Gynecology;  Laterality: N/A;   LYSIS OF ADHESION N/A 11/17/2014   Procedure: LYSIS OF ADHESION;  Surgeon: Denette Finner, MD;  Location: [facility];  Service: Gynecology;  Laterality: N/A;   OVARIAN CYST REMOVAL Right 11/17/2014   Procedure: OVARIAN CYSTECTOMY;  Surgeon: Denette Finner, MD;  Location: [facility];  Service: Gynecology;  Laterality: Right;   WISDOM TOOTH EXTRACTION     Social History   Tobacco Use   Smoking status: Former    Current packs/day: 0.00    Average packs/day: 0.3 packs/day for 4.0 years (1.0 ttl pk-yrs)    Types: Cigarettes    Start date: 12/18/1994    Quit date: 12/18/1998    Years since quitting: 25.3   Smokeless tobacco: Never   Tobacco comments:    Quit in college and was not an everyday smoker  Vaping Use   Vaping status: Never Used  Substance Use Topics   Alcohol use: Yes    Alcohol/week: 0.0 standard drinks of alcohol    Comment: socially   Drug use: No   Family History  Problem Relation Age of Onset   Hypertension Maternal Grandmother    Cancer Maternal Grandfather    COPD Maternal Grandfather    Stroke Paternal Grandfather     COPD Paternal Grandfather    Allergies  Allergen Reactions   Erythromycin Nausea And Vomiting    REACTION: n \\T \ v   Penicillins     REACTION: mom has rxn Patient has never taken PCN Her mom can take cephalosporins without problems   Soy Allergy (Obsolete) Hives   Current Outpatient Medications on File Prior to Visit  Medication Sig Dispense Refill   atorvastatin  (LIPITOR) 10 MG tablet TAKE 1 TABLET(10 MG) BY MOUTH DAILY 90 tablet 1   fluticasone -salmeterol (WIXELA INHUB) 100-50 MCG/ACT AEPB INHALE 1 PUFF BY MOUTH TWICE DAILY. RINSE MOUTH AFTER USE 180 each 1   levothyroxine  (SYNTHROID ) 175 MCG tablet TAKE 1 TABLET BY MOUTH DAILY BEFORE BREAKFAST 90 tablet 1   Semaglutide -Weight Management (WEGOVY ) 0.25 MG/0.5ML SOAJ Inject 0.25 mg into the skin once a week. 2 mL 3   No current facility-administered medications on file prior to visit.    Review of Systems  Constitutional:  Negative for activity change, appetite change, fatigue, fever and unexpected weight change.  HENT:  Negative for congestion, ear pain, rhinorrhea, sinus pressure and sore throat.   Eyes:  Negative for pain, redness and visual disturbance.  Respiratory:  Negative for cough, shortness of breath and wheezing.   Cardiovascular:  Negative for chest pain and palpitations.  Gastrointestinal:  Positive for constipation. Negative for abdominal pain, blood in stool and diarrhea.  Endocrine: Negative for polydipsia and polyuria.  Genitourinary:  Negative for dysuria, frequency and urgency.  Musculoskeletal:  Negative for arthralgias, back pain and myalgias.  Skin:  Negative for pallor and rash.  Allergic/Immunologic: Negative for environmental allergies.  Neurological:  Negative for dizziness, syncope and headaches.  Hematological:  Negative for adenopathy. Does not bruise/bleed easily.  Psychiatric/Behavioral:  Negative for decreased concentration and dysphoric mood. The patient is not nervous/anxious.         Objective:   Physical Exam Constitutional:      General: She is not in acute distress.    Appearance: Normal appearance. She is obese. She is not ill-appearing.  Eyes:     Conjunctiva/sclera: Conjunctivae normal.     Pupils: Pupils are equal, round, and reactive to light.  Cardiovascular:     Rate and Rhythm: Normal rate and regular rhythm.  Pulmonary:     Effort: Pulmonary effort is normal. No respiratory distress.  Musculoskeletal:     Right lower leg: No edema.     Left lower leg: No edema.  Neurological:     Mental Status: She is alert.     Coordination: Coordination normal.  Psychiatric:  Mood and Affect: Mood normal.           Assessment & Plan:   Problem List Items Addressed This Visit       Endocrine   Hypothyroidism   Hypothyroidism  Pt has no clinical changes (fatigue is baseline) No change in energy level/ hair or skin/ edema and no tremor Lab Results  Component Value Date   TSH 2.39 10/17/2023    Takes daw 175 mcg levothyroxine  daily   Lab today         Other   Prediabetes   Lab Results  Component Value Date   HGBA1C 5.9 10/17/2023   disc imp of low glycemic diet and wt loss to prevent DM2       OBESITY, MORBID - Primary   Pt tolerates wegovy  at 0.25 mg but no higher  It is helping appetite and weight is down 8 lb so far  Discussed importance of adding strength training exercise like pilates/weights  Discussed importance of lean protein  Also needs to work on decreasing emotional eating also -strategies for that reviewed, not interested in counseling currently   Will continue 0.25 mg weekly  Will call if concerns or side effects   Follow up 6 mo for annual exam

## 2024-05-02 ENCOUNTER — Other Ambulatory Visit: Payer: Self-pay | Admitting: Family Medicine

## 2024-07-08 ENCOUNTER — Other Ambulatory Visit: Payer: Self-pay | Admitting: Family Medicine

## 2024-07-08 NOTE — Telephone Encounter (Signed)
 Wegovy  last filled on 02/28/24 #2 mL/ 3 refills CPE 10/13/24

## 2024-08-01 ENCOUNTER — Other Ambulatory Visit: Payer: Self-pay | Admitting: Family Medicine

## 2024-08-11 ENCOUNTER — Other Ambulatory Visit (HOSPITAL_COMMUNITY): Payer: Self-pay

## 2024-08-21 ENCOUNTER — Encounter: Payer: Self-pay | Admitting: Family Medicine

## 2024-08-22 ENCOUNTER — Telehealth: Payer: Self-pay

## 2024-08-22 ENCOUNTER — Other Ambulatory Visit (HOSPITAL_COMMUNITY): Payer: Self-pay

## 2024-08-22 NOTE — Telephone Encounter (Signed)
 Insurance companies are becoming increasingly stricter about requiring thorough documentation of lifestyle modifications in the patient's chart at each visit. This includes detailed records of diet recommendations (caloric intake, etc), exercise plans (amount of time/wk, etc), and an emphasis on the patient's commitment to continuing these efforts while on medication.  Without this additional documentation in the chart notes, a prior authorization will most likely be denied.    I have the form to fill out the PA request, however we will need a BMI and weight check within 45 days of the request. The last weight was 04/09/24.

## 2024-08-22 NOTE — Telephone Encounter (Signed)
 Let her know we need a visit in order to have utd documentation for the new stricter requirements for prior auth Schedule when able

## 2024-08-22 NOTE — Telephone Encounter (Signed)
 Please schedule f/u to discuss weight/ wegovy  in order to try and proceed with PA. Thanks

## 2024-08-25 ENCOUNTER — Other Ambulatory Visit (HOSPITAL_COMMUNITY): Payer: Self-pay

## 2024-08-25 NOTE — Telephone Encounter (Signed)
 I called patient and left a voicemail for patient to be scheduled.

## 2024-09-03 ENCOUNTER — Other Ambulatory Visit (HOSPITAL_COMMUNITY): Payer: Self-pay

## 2024-09-04 NOTE — Telephone Encounter (Signed)
 lvm for pt to call office to schedule appt.

## 2024-10-05 ENCOUNTER — Telehealth: Payer: Self-pay | Admitting: Family Medicine

## 2024-10-05 DIAGNOSIS — R7303 Prediabetes: Secondary | ICD-10-CM

## 2024-10-05 DIAGNOSIS — E039 Hypothyroidism, unspecified: Secondary | ICD-10-CM

## 2024-10-05 DIAGNOSIS — Z0000 Encounter for general adult medical examination without abnormal findings: Secondary | ICD-10-CM

## 2024-10-05 DIAGNOSIS — E7800 Pure hypercholesterolemia, unspecified: Secondary | ICD-10-CM

## 2024-10-05 DIAGNOSIS — E559 Vitamin D deficiency, unspecified: Secondary | ICD-10-CM

## 2024-10-05 NOTE — Telephone Encounter (Signed)
-----   Message from Harlene Du sent at 09/19/2024  1:43 PM EDT ----- Regarding: Lab Mon 10/06/24 Hello,  Patient is coming in for CPE labs on Monday 10/06/24. Can we get orders please.   Thanks

## 2024-10-06 ENCOUNTER — Ambulatory Visit: Payer: Self-pay | Admitting: Family Medicine

## 2024-10-06 ENCOUNTER — Other Ambulatory Visit

## 2024-10-06 DIAGNOSIS — E039 Hypothyroidism, unspecified: Secondary | ICD-10-CM

## 2024-10-06 DIAGNOSIS — R7303 Prediabetes: Secondary | ICD-10-CM | POA: Diagnosis not present

## 2024-10-06 DIAGNOSIS — E559 Vitamin D deficiency, unspecified: Secondary | ICD-10-CM

## 2024-10-06 DIAGNOSIS — Z0000 Encounter for general adult medical examination without abnormal findings: Secondary | ICD-10-CM

## 2024-10-06 DIAGNOSIS — E7800 Pure hypercholesterolemia, unspecified: Secondary | ICD-10-CM | POA: Diagnosis not present

## 2024-10-06 LAB — CBC WITH DIFFERENTIAL/PLATELET
Basophils Absolute: 0.1 K/uL (ref 0.0–0.1)
Basophils Relative: 1.1 % (ref 0.0–3.0)
Eosinophils Absolute: 0.3 K/uL (ref 0.0–0.7)
Eosinophils Relative: 6.4 % — ABNORMAL HIGH (ref 0.0–5.0)
HCT: 41.4 % (ref 36.0–46.0)
Hemoglobin: 13.3 g/dL (ref 12.0–15.0)
Lymphocytes Relative: 36 % (ref 12.0–46.0)
Lymphs Abs: 1.7 K/uL (ref 0.7–4.0)
MCHC: 32.2 g/dL (ref 30.0–36.0)
MCV: 86.5 fl (ref 78.0–100.0)
Monocytes Absolute: 0.3 K/uL (ref 0.1–1.0)
Monocytes Relative: 6.8 % (ref 3.0–12.0)
Neutro Abs: 2.3 K/uL (ref 1.4–7.7)
Neutrophils Relative %: 49.7 % (ref 43.0–77.0)
Platelets: 165 K/uL (ref 150.0–400.0)
RBC: 4.78 Mil/uL (ref 3.87–5.11)
RDW: 14.5 % (ref 11.5–15.5)
WBC: 4.7 K/uL (ref 4.0–10.5)

## 2024-10-06 LAB — TSH: TSH: 3.93 u[IU]/mL (ref 0.35–5.50)

## 2024-10-06 LAB — COMPREHENSIVE METABOLIC PANEL WITH GFR
ALT: 19 U/L (ref 0–35)
AST: 14 U/L (ref 0–37)
Albumin: 4.1 g/dL (ref 3.5–5.2)
Alkaline Phosphatase: 59 U/L (ref 39–117)
BUN: 17 mg/dL (ref 6–23)
CO2: 28 meq/L (ref 19–32)
Calcium: 9.1 mg/dL (ref 8.4–10.5)
Chloride: 106 meq/L (ref 96–112)
Creatinine, Ser: 0.97 mg/dL (ref 0.40–1.20)
GFR: 69.86 mL/min (ref >60.00)
Glucose, Bld: 108 mg/dL — ABNORMAL HIGH (ref 70–99)
Potassium: 4 meq/L (ref 3.5–5.1)
Sodium: 141 meq/L (ref 135–145)
Total Bilirubin: 0.6 mg/dL (ref 0.2–1.2)
Total Protein: 6.4 g/dL (ref 6.0–8.3)

## 2024-10-06 LAB — LIPID PANEL
Cholesterol: 141 mg/dL (ref 0–200)
HDL: 47.4 mg/dL (ref >39.00)
LDL Cholesterol: 70 mg/dL (ref 0–99)
NonHDL: 93.52
Total CHOL/HDL Ratio: 3
Triglycerides: 116 mg/dL (ref 0.0–149.0)
VLDL: 23.2 mg/dL (ref 0.0–40.0)

## 2024-10-06 LAB — VITAMIN D 25 HYDROXY (VIT D DEFICIENCY, FRACTURES): VITD: 33.46 ng/mL (ref 30.00–100.00)

## 2024-10-06 LAB — HEMOGLOBIN A1C: Hgb A1c MFr Bld: 6 % (ref 4.6–6.5)

## 2024-10-08 ENCOUNTER — Encounter: Payer: Self-pay | Admitting: Family Medicine

## 2024-10-08 ENCOUNTER — Other Ambulatory Visit: Payer: Self-pay | Admitting: Family Medicine

## 2024-10-13 ENCOUNTER — Encounter: Payer: Self-pay | Admitting: Family Medicine

## 2024-10-13 ENCOUNTER — Ambulatory Visit (INDEPENDENT_AMBULATORY_CARE_PROVIDER_SITE_OTHER): Admitting: Family Medicine

## 2024-10-13 VITALS — BP 136/86 | HR 78 | Temp 98.4°F | Ht 66.25 in | Wt 396.0 lb

## 2024-10-13 DIAGNOSIS — R7401 Elevation of levels of liver transaminase levels: Secondary | ICD-10-CM

## 2024-10-13 DIAGNOSIS — Z0000 Encounter for general adult medical examination without abnormal findings: Secondary | ICD-10-CM

## 2024-10-13 DIAGNOSIS — E039 Hypothyroidism, unspecified: Secondary | ICD-10-CM | POA: Diagnosis not present

## 2024-10-13 DIAGNOSIS — Z1211 Encounter for screening for malignant neoplasm of colon: Secondary | ICD-10-CM | POA: Insufficient documentation

## 2024-10-13 DIAGNOSIS — R7303 Prediabetes: Secondary | ICD-10-CM | POA: Diagnosis not present

## 2024-10-13 DIAGNOSIS — Z23 Encounter for immunization: Secondary | ICD-10-CM

## 2024-10-13 DIAGNOSIS — E559 Vitamin D deficiency, unspecified: Secondary | ICD-10-CM

## 2024-10-13 DIAGNOSIS — E7800 Pure hypercholesterolemia, unspecified: Secondary | ICD-10-CM

## 2024-10-13 MED ORDER — METFORMIN HCL ER 500 MG PO TB24: 500.0000 mg | ORAL_TABLET | Freq: Every day | ORAL | 3 refills | Status: AC

## 2024-10-13 NOTE — Assessment & Plan Note (Addendum)
 Reviewed health habits including diet and exercise and skin cancer prevention Reviewed appropriate screening tests for age  Also reviewed health mt list, fam hx and immunization status , as well as social and family history   See HPI Labs reviewed and ordered Health Maintenance  Topic Date Due   Hepatitis B Vaccine (1 of 3 - 19+ 3-dose series) Never done   Breast Cancer Screening  11/28/2024   Pap with HPV screening  10/16/2024*   DTaP/Tdap/Td vaccine (2 - Tdap) 10/16/2024*   Colon Cancer Screening  04/09/2025*   Hepatitis C Screening  04/09/2025*   HIV Screening  04/09/2025*   COVID-19 Vaccine (3 - 2025-26 season) 10/29/2026*   Flu Shot  Completed   Pneumococcal Vaccine  Aged Out   HPV Vaccine  Aged Out   Meningitis B Vaccine  Aged Out  *Topic was postponed. The date shown is not the original due date.   Flu vaccine today  Mammo due in December-will call if she does not get reminder Utd gyn care/ endometriosis and infertility Cologuard ordered for colon screening  Discussed fall prevention, supplements and exercise for bone density  Mood is stable

## 2024-10-13 NOTE — Assessment & Plan Note (Signed)
 Disc goals for lipids and reasons to control them Rev last labs with pt Rev low sat fat diet in detail Labs today  Taking atorvastatin  10 mg daily  LDL 70 last check

## 2024-10-13 NOTE — Progress Notes (Signed)
 Subjective:    Patient ID: Nancy Dunlap, female    DOB: 17-Nov-1976, 48 y.o.   MRN: [id number]  HPI  Here for health maintenance exam and to review chronic medical problems   Wt Readings from Last 3 Encounters:  10/13/24 (!) 396 lb (179.6 kg)  04/09/24 (!) 392 lb (177.8 kg)  10/17/23 (!) 400 lb 4 oz (181.6 kg)   63.43 kg/m  Vitals:   10/13/24 0904  BP: 136/86  Pulse: 78  Temp: 98.4 F (36.9 C)  SpO2: 95%    Immunization History  Administered Date(s) Administered   Influenza Whole 10/28/2010   Influenza, Seasonal, Injecte, Preservative Fre 10/17/2023, 10/13/2024   Influenza,inj,Quad PF,6+ Mos 11/18/2014, 11/08/2015, 11/15/2016, 12/12/2017, 11/13/2018, 09/28/2022   Janssen (J&J) SARS-COV-2 Vaccination 03/24/2020   PFIZER Comirnaty(Gray Top)Covid-19 Tri-Sucrose Vaccine 06/17/2021   Pneumococcal Polysaccharide-23 11/19/2014   Td 03/19/2012    Health Maintenance Due  Topic Date Due   Hepatitis B Vaccines 19-59 Average Risk (1 of 3 - 19+ 3-dose series) Never done   Mammogram  11/28/2024     Mammogram- mobile mammo 11/28/24  Self breast exam- no lumps   Gyn health Endometriosis -irreg periods  /not on contraception / infertility  No updates     Colon cancer screening -interested in cologuard test  Wants to avoid colonoscopy unless necessary   Bone health   Falls-none Fractures- none  Supplements -vit D3  Last vitamin D  Lab Results  Component Value Date   VD25OH 33.46 10/06/2024    Exercise  Off an on/ mostly walking  Has some weights/band     Mood    07/25/2022    8:35 AM 03/25/2021    9:36 AM 02/18/2020    8:33 AM 12/12/2017   12:18 PM  Depression screen PHQ 2/9  Decreased Interest 0 1 0 0  Down, Depressed, Hopeless 0 0 0 0  PHQ - 2 Score 0 1 0 0  Altered sleeping  3 0   Tired, decreased energy  3 1   Change in appetite   0   Feeling bad or failure about yourself   1 0   Trouble concentrating  2 0   Moving slowly or fidgety/restless  0  0   Suicidal thoughts  0 0   PHQ-9 Score  10 1   Difficult doing work/chores   Not difficult at all    Hypothyroidism  Pt has no clinical changes No change in energy level/ hair or skin/ edema and no tremor Lab Results  Component Value Date   TSH 3.93 10/06/2024    Levothyroxine  175 mcg daily   Prediabetes Lab Results  Component Value Date   HGBA1C 6.0 10/06/2024   HGBA1C 5.9 10/17/2023   Eating healthy  Using a meal service/hello fresh     Liver fxn  Lab Results  Component Value Date   ALT 19 10/06/2024   AST 14 10/06/2024   ALKPHOS 59 10/06/2024   BILITOT 0.6 10/06/2024    Hyperlipidemia Lab Results  Component Value Date   CHOL 141 10/06/2024   CHOL 137 10/17/2023   CHOL 127 09/28/2022   Lab Results  Component Value Date   HDL 47.40 10/06/2024   HDL 50.10 10/17/2023   HDL 48.00 09/28/2022   Lab Results  Component Value Date   LDLCALC 70 10/06/2024   LDLCALC 68 10/17/2023   LDLCALC 57 09/28/2022   Lab Results  Component Value Date   TRIG 116.0 10/06/2024   TRIG 96.0 10/17/2023  TRIG 108.0 09/28/2022   Lab Results  Component Value Date   CHOLHDL 3 10/06/2024   CHOLHDL 3 10/17/2023   CHOLHDL 3 09/28/2022   Lab Results  Component Value Date   LDLDIRECT 126.4 04/10/2012   LDLDIRECT 135.3 10/28/2010   LDLDIRECT 139.3 09/30/2007   Atorvastatin  10 mg daily   Was on wegovy  low dose prior  Ins stopped covering  It also caused gi upset   Lab Results  Component Value Date   ALT 19 10/06/2024   AST 14 10/06/2024   ALKPHOS 59 10/06/2024   BILITOT 0.6 10/06/2024      Patient Active Problem List   Diagnosis Date Noted   Colon cancer screening 10/13/2024   Encounter for screening mammogram for breast cancer 10/17/2023   Prediabetes 10/17/2023   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   Hydrosalpinx 11/15/2022   Pedal edema 06/01/2021   Vitamin D  deficiency 03/25/2021    Elevated liver transaminase level 03/25/2021   Grief reaction 03/25/2021   Endometriosis 12/12/2017   S/P laparotomy 11/17/2014   Routine general medical examination at a health care facility 03/16/2011   Hyperlipidemia 01/03/2010   Hypothyroidism 10/07/2007   OBESITY, MORBID 09/30/2007   Past Medical History:  Diagnosis Date   Bronchitis    just finished abx  11/01/14, has inhaler for bronchitis tx   Edema    Morbid obesity (HCC)    Other and unspecified hyperlipidemia    no meds   Unspecified hypothyroidism    Past Surgical History:  Procedure Laterality Date   EYE SURGERY     lazy eye as a child   LAPAROTOMY N/A 11/17/2014   Procedure: EXPLORATORY LAPAROTOMY, chromopertubation;  Surgeon: Alm JAYSON Cook, MD;  Location: [facility];  Service: Gynecology;  Laterality: N/A;   LYSIS OF ADHESION N/A 11/17/2014   Procedure: LYSIS OF ADHESION;  Surgeon: Alm JAYSON Cook, MD;  Location: [facility];  Service: Gynecology;  Laterality: N/A;   OVARIAN CYST REMOVAL Right 11/17/2014   Procedure: OVARIAN CYSTECTOMY;  Surgeon: Alm JAYSON Cook, MD;  Location: [facility];  Service: Gynecology;  Laterality: Right;   WISDOM TOOTH EXTRACTION     Social History   Tobacco Use   Smoking status: Former    Current packs/day: 0.00    Average packs/day: 0.3 packs/day for 4.0 years (1.0 ttl pk-yrs)    Types: Cigarettes    Start date: 12/18/1994    Quit date: 12/18/1998    Years since quitting: 25.8   Smokeless tobacco: Never   Tobacco comments:    Quit in college and was not an everyday smoker  Vaping Use   Vaping status: Never Used  Substance Use Topics   Alcohol use: Yes    Alcohol/week: 0.0 standard drinks of alcohol    Comment: socially   Drug use: No   Family History  Problem Relation Age of Onset   Hypertension Maternal Grandmother    Cancer Maternal Grandfather    COPD Maternal Grandfather    Stroke Paternal Grandfather    COPD Paternal Grandfather    Allergies  Allergen Reactions   Erythromycin Nausea  And Vomiting    REACTION: n \\T \ v   Penicillins     REACTION: mom has rxn Patient has never taken PCN Her mom can take cephalosporins without problems   Soy Allergy (Obsolete) Hives   Current Outpatient Medications on File Prior to Visit  Medication Sig Dispense Refill   atorvastatin  (LIPITOR) 10 MG  tablet TAKE ONE TABLET BY MOUTH ONE TIME DAILY 90 tablet 0   fluticasone -salmeterol (WIXELA INHUB) 100-50 MCG/ACT AEPB INHALE 1 PUFF BY MOUTH TWICE DAILY. RINSE MOUTH AFTER USE 180 each 1   levothyroxine  (SYNTHROID ) 175 MCG tablet TAKE ONE TABLET BY MOUTH ONE TIME DAILY BEFORE BREAKFAST 90 tablet 2   No current facility-administered medications on file prior to visit.    Review of Systems  Constitutional:  Negative for activity change, appetite change, fatigue, fever and unexpected weight change.  HENT:  Negative for congestion, ear pain, rhinorrhea, sinus pressure and sore throat.   Eyes:  Negative for pain, redness and visual disturbance.  Respiratory:  Negative for cough, shortness of breath and wheezing.   Cardiovascular:  Negative for chest pain and palpitations.  Gastrointestinal:  Negative for abdominal pain, blood in stool, constipation and diarrhea.  Endocrine: Negative for polydipsia and polyuria.  Genitourinary:  Negative for dysuria, frequency and urgency.  Musculoskeletal:  Negative for arthralgias, back pain and myalgias.  Skin:  Negative for pallor and rash.  Allergic/Immunologic: Negative for environmental allergies.  Neurological:  Negative for dizziness, syncope and headaches.  Hematological:  Negative for adenopathy. Does not bruise/bleed easily.  Psychiatric/Behavioral:  Negative for decreased concentration and dysphoric mood. The patient is not nervous/anxious.        Objective:   Physical Exam Constitutional:      General: She is not in acute distress.    Appearance: Normal appearance. She is well-developed. She is obese. She is not ill-appearing or  diaphoretic.  HENT:     Head: Normocephalic and atraumatic.     Right Ear: Tympanic membrane, ear canal and external ear normal.     Left Ear: Tympanic membrane, ear canal and external ear normal.     Nose: Nose normal. No congestion.     Mouth/Throat:     Mouth: Mucous membranes are moist.     Pharynx: Oropharynx is clear. No posterior oropharyngeal erythema.  Eyes:     General: No scleral icterus.    Extraocular Movements: Extraocular movements intact.     Conjunctiva/sclera: Conjunctivae normal.     Pupils: Pupils are equal, round, and reactive to light.  Neck:     Thyroid : No thyromegaly.     Vascular: No carotid bruit or JVD.  Cardiovascular:     Rate and Rhythm: Normal rate and regular rhythm.     Pulses: Normal pulses.     Heart sounds: Normal heart sounds.     No gallop.  Pulmonary:     Effort: Pulmonary effort is normal. No respiratory distress.     Breath sounds: Normal breath sounds. No wheezing.     Comments: Good air exch Chest:     Chest wall: No tenderness.  Abdominal:     General: Bowel sounds are normal. There is no distension or abdominal bruit.     Palpations: Abdomen is soft. There is no mass.     Tenderness: There is no abdominal tenderness.     Hernia: No hernia is present.  Genitourinary:    Comments: Breast exam: No mass, nodules, thickening, tenderness, bulging, retraction, inflamation, nipple discharge or skin changes noted.  No axillary or clavicular LA.     Musculoskeletal:        General: No tenderness. Normal range of motion.     Cervical back: Normal range of motion and neck supple. No rigidity. No muscular tenderness.     Right lower leg: No edema.     Left lower  leg: No edema.     Comments: No kyphosis   Lymphadenopathy:     Cervical: No cervical adenopathy.  Skin:    General: Skin is warm and dry.     Coloration: Skin is not pale.     Findings: No erythema or rash.     Comments: Solar lentigines diffusely   Neurological:     Mental  Status: She is alert. Mental status is at baseline.     Cranial Nerves: No cranial nerve deficit.     Motor: No abnormal muscle tone.     Coordination: Coordination normal.     Gait: Gait normal.     Deep Tendon Reflexes: Reflexes are normal and symmetric. Reflexes normal.  Psychiatric:        Mood and Affect: Mood normal.        Cognition and Memory: Cognition and memory normal.           Assessment & Plan:   Problem List Items Addressed This Visit       Endocrine   Hypothyroidism   Hypothyroidism  Pt has no clinical changes (fatigue is baseline) No change in energy level/ hair or skin/ edema and no tremor Lab Results  Component Value Date   TSH 3.93 10/06/2024    Takes daw 175 mcg levothyroxine  daily           Other   Vitamin D  deficiency   Vitamin D  level is therapeutic with current supplementation Disc importance of this to bone and overall health  Continues 6000 international units daily  Last vitamin D  Lab Results  Component Value Date   VD25OH 33.46 10/06/2024         Routine general medical examination at a health care facility - Primary   Reviewed health habits including diet and exercise and skin cancer prevention Reviewed appropriate screening tests for age  Also reviewed health mt list, fam hx and immunization status , as well as social and family history   See HPI Labs reviewed and ordered Health Maintenance  Topic Date Due   Hepatitis B Vaccine (1 of 3 - 19+ 3-dose series) Never done   Breast Cancer Screening  11/28/2024   Pap with HPV screening  10/16/2024*   DTaP/Tdap/Td vaccine (2 - Tdap) 10/16/2024*   Colon Cancer Screening  04/09/2025*   Hepatitis C Screening  04/09/2025*   HIV Screening  04/09/2025*   COVID-19 Vaccine (3 - 2025-26 season) 10/29/2026*   Flu Shot  Completed   Pneumococcal Vaccine  Aged Out   HPV Vaccine  Aged Out   Meningitis B Vaccine  Aged Out  *Topic was postponed. The date shown is not the original due date.    Flu vaccine today  Mammo due in December-will call if she does not get reminder Utd gyn care/ endometriosis and infertility Cologuard ordered for colon screening  Discussed fall prevention, supplements and exercise for bone density  Mood is stable       Prediabetes   Lab Results  Component Value Date   HGBA1C 6.0 10/06/2024   HGBA1C 5.9 10/17/2023   disc imp of low glycemic diet and wt loss to prevent DM2       OBESITY, MORBID   Did not tolerate wegovy  Insurance did not cover and also caused GI upset  ? In future with better ins coverage if tirzepatide would be better covered  May be option in future        Relevant Medications   metFORMIN (GLUCOPHAGE-XR) 500 MG  24 hr tablet   Hyperlipidemia   Disc goals for lipids and reasons to control them Rev last labs with pt Rev low sat fat diet in detail Labs today  Taking atorvastatin  10 mg daily  LDL 70 last check       Elevated liver transaminase level   LFTs normal last check      Colon cancer screening   Cologuard ordered Pt only wants to do colonoscopy if this is positive Discussed options       Relevant Orders   Cologuard   Other Visit Diagnoses       Need for influenza vaccination       Relevant Orders   Flu vaccine trivalent PF, 6mos and older(Flulaval,Afluria,Fluarix,Fluzone) (Completed)

## 2024-10-13 NOTE — Patient Instructions (Addendum)
 Watch for reminder for mammogram   I sent an order for cologuard test  If you do not hear from the company in a week to set that up   Keep walking Add some strength training to your routine, this is important for bone and brain health and can reduce your risk of falls and help your body use insulin properly and regulate weight  Light weights, exercise bands , and internet videos are a good way to start  Yoga (chair or regular), machines , floor exercises or a gym with machines are also good options     For prediabetes and weight  Try metfformin xr 500 mg daily (if any side effects like diarrhea, stop it)  If you do well with it - we can go up on the dose   Flu shot today

## 2024-10-13 NOTE — Assessment & Plan Note (Signed)
 LFTs normal last check

## 2024-10-13 NOTE — Assessment & Plan Note (Signed)
 Hypothyroidism  Pt has no clinical changes (fatigue is baseline) No change in energy level/ hair or skin/ edema and no tremor Lab Results  Component Value Date   TSH 3.93 10/06/2024    Takes daw 175 mcg levothyroxine  daily

## 2024-10-13 NOTE — Assessment & Plan Note (Signed)
 Vitamin D  level is therapeutic with current supplementation Disc importance of this to bone and overall health  Continues 6000 international units daily  Last vitamin D  Lab Results  Component Value Date   VD25OH 33.46 10/06/2024

## 2024-10-13 NOTE — Assessment & Plan Note (Signed)
 Did not tolerate wegovy  Insurance did not cover and also caused GI upset  ? In future with better ins coverage if tirzepatide would be better covered  May be option in future

## 2024-10-13 NOTE — Assessment & Plan Note (Signed)
 Lab Results  Component Value Date   HGBA1C 6.0 10/06/2024   HGBA1C 5.9 10/17/2023   disc imp of low glycemic diet and wt loss to prevent DM2

## 2024-10-13 NOTE — Assessment & Plan Note (Signed)
 Cologuard ordered Pt only wants to do colonoscopy if this is positive Discussed options

## 2024-10-21 ENCOUNTER — Other Ambulatory Visit: Payer: Self-pay | Admitting: Family Medicine

## 2024-10-21 NOTE — Telephone Encounter (Signed)
 Copied from CRM #[id number]. Topic: Clinical - Prescription Issue >> Oct 21, 2024  3:48 PM Tysheama G wrote: Reason for CRM: Patient is calling regarding her medication atorvastatin  (LIPITOR) 10 MG tablet; she's going on a cruise and leaving early tomorrow morning and wanted to know if Dr.Tower can sign off for it today possibly. She has 3 pills left and will be out on her 4day cruise. Callback number [phone]

## 2024-11-08 LAB — COLOGUARD: COLOGUARD: NEGATIVE

## 2024-11-09 ENCOUNTER — Ambulatory Visit: Payer: Self-pay | Admitting: Family Medicine

## 2024-12-16 ENCOUNTER — Other Ambulatory Visit: Payer: Self-pay | Admitting: Family Medicine

## 2025-01-23 ENCOUNTER — Other Ambulatory Visit: Payer: Self-pay | Admitting: Family Medicine
